# Patient Record
Sex: Female | Born: 1955 | Race: Black or African American | Hispanic: No | Smoking: Never smoker
Health system: Southern US, Community
[De-identification: ages and names within clinical notes are randomized; demographics above are authoritative.]

## PROBLEM LIST (undated history)

## (undated) DIAGNOSIS — I10 Essential (primary) hypertension: Secondary | ICD-10-CM

## (undated) DIAGNOSIS — D649 Anemia, unspecified: Secondary | ICD-10-CM

## (undated) DIAGNOSIS — E58 Dietary calcium deficiency: Secondary | ICD-10-CM

## (undated) DIAGNOSIS — R011 Cardiac murmur, unspecified: Secondary | ICD-10-CM

## (undated) DIAGNOSIS — I739 Peripheral vascular disease, unspecified: Secondary | ICD-10-CM

## (undated) DIAGNOSIS — E785 Hyperlipidemia, unspecified: Secondary | ICD-10-CM

## (undated) HISTORY — DX: Peripheral vascular disease, unspecified: I73.9

## (undated) HISTORY — DX: Hyperlipidemia, unspecified: E78.5

## (undated) HISTORY — DX: Anemia, unspecified: D64.9

---

## 1999-04-16 ENCOUNTER — Emergency Department (HOSPITAL_COMMUNITY): Admission: EM | Admit: 1999-04-16 | Discharge: 1999-04-16 | Payer: Self-pay | Admitting: Emergency Medicine

## 1999-05-26 ENCOUNTER — Emergency Department (HOSPITAL_COMMUNITY): Admission: EM | Admit: 1999-05-26 | Discharge: 1999-05-26 | Payer: Self-pay | Admitting: Internal Medicine

## 1999-10-08 ENCOUNTER — Emergency Department (HOSPITAL_COMMUNITY): Admission: EM | Admit: 1999-10-08 | Discharge: 1999-10-08 | Payer: Self-pay | Admitting: *Deleted

## 2000-07-01 ENCOUNTER — Emergency Department (HOSPITAL_COMMUNITY): Admission: EM | Admit: 2000-07-01 | Discharge: 2000-07-01 | Payer: Self-pay

## 2000-08-19 ENCOUNTER — Inpatient Hospital Stay (HOSPITAL_COMMUNITY): Admission: AD | Admit: 2000-08-19 | Discharge: 2000-08-19 | Payer: Self-pay | Admitting: Obstetrics and Gynecology

## 2000-08-26 ENCOUNTER — Encounter: Payer: Self-pay | Admitting: Obstetrics

## 2000-08-26 ENCOUNTER — Ambulatory Visit (HOSPITAL_COMMUNITY): Admission: RE | Admit: 2000-08-26 | Discharge: 2000-08-26 | Payer: Self-pay | Admitting: Obstetrics

## 2000-09-04 ENCOUNTER — Encounter: Payer: Self-pay | Admitting: Obstetrics

## 2000-09-04 ENCOUNTER — Encounter: Admission: RE | Admit: 2000-09-04 | Discharge: 2000-09-04 | Payer: Self-pay | Admitting: Obstetrics

## 2000-09-19 ENCOUNTER — Inpatient Hospital Stay (HOSPITAL_COMMUNITY): Admission: AD | Admit: 2000-09-19 | Discharge: 2000-09-19 | Payer: Self-pay | Admitting: Obstetrics

## 2000-10-17 ENCOUNTER — Inpatient Hospital Stay (HOSPITAL_COMMUNITY): Admission: AD | Admit: 2000-10-17 | Discharge: 2000-10-17 | Payer: Self-pay | Admitting: Obstetrics

## 2000-11-14 ENCOUNTER — Inpatient Hospital Stay (HOSPITAL_COMMUNITY): Admission: AD | Admit: 2000-11-14 | Discharge: 2000-11-14 | Payer: Self-pay | Admitting: Obstetrics

## 2000-12-12 ENCOUNTER — Inpatient Hospital Stay (HOSPITAL_COMMUNITY): Admission: AD | Admit: 2000-12-12 | Discharge: 2000-12-12 | Payer: Self-pay | Admitting: Obstetrics

## 2001-01-10 ENCOUNTER — Inpatient Hospital Stay (HOSPITAL_COMMUNITY): Admission: AD | Admit: 2001-01-10 | Discharge: 2001-01-10 | Payer: Self-pay | Admitting: Obstetrics

## 2001-09-28 ENCOUNTER — Encounter: Payer: Self-pay | Admitting: Obstetrics

## 2001-09-28 ENCOUNTER — Ambulatory Visit (HOSPITAL_COMMUNITY): Admission: RE | Admit: 2001-09-28 | Discharge: 2001-09-28 | Payer: Self-pay | Admitting: Obstetrics

## 2002-08-12 ENCOUNTER — Ambulatory Visit (HOSPITAL_COMMUNITY): Admission: RE | Admit: 2002-08-12 | Discharge: 2002-08-12 | Payer: Self-pay | Admitting: Family Medicine

## 2002-08-12 ENCOUNTER — Encounter: Payer: Self-pay | Admitting: Family Medicine

## 2002-10-19 ENCOUNTER — Encounter: Admission: RE | Admit: 2002-10-19 | Discharge: 2002-10-19 | Payer: Self-pay | Admitting: Obstetrics

## 2002-10-19 ENCOUNTER — Encounter: Payer: Self-pay | Admitting: Obstetrics

## 2002-11-27 ENCOUNTER — Encounter: Payer: Self-pay | Admitting: Emergency Medicine

## 2002-11-27 ENCOUNTER — Emergency Department (HOSPITAL_COMMUNITY): Admission: AD | Admit: 2002-11-27 | Discharge: 2002-11-27 | Payer: Self-pay | Admitting: Emergency Medicine

## 2003-03-09 ENCOUNTER — Encounter: Admission: RE | Admit: 2003-03-09 | Discharge: 2003-04-27 | Payer: Self-pay | Admitting: Orthopedic Surgery

## 2004-06-27 ENCOUNTER — Ambulatory Visit (HOSPITAL_COMMUNITY): Admission: RE | Admit: 2004-06-27 | Discharge: 2004-06-27 | Payer: Self-pay | Admitting: Cardiology

## 2006-02-20 ENCOUNTER — Emergency Department (HOSPITAL_COMMUNITY): Admission: EM | Admit: 2006-02-20 | Discharge: 2006-02-20 | Payer: Self-pay | Admitting: Emergency Medicine

## 2006-03-25 ENCOUNTER — Encounter: Admission: RE | Admit: 2006-03-25 | Discharge: 2006-03-25 | Payer: Self-pay | Admitting: Internal Medicine

## 2006-03-31 ENCOUNTER — Encounter: Admission: RE | Admit: 2006-03-31 | Discharge: 2006-03-31 | Payer: Self-pay | Admitting: Cardiology

## 2007-02-17 ENCOUNTER — Encounter: Admission: RE | Admit: 2007-02-17 | Discharge: 2007-02-17 | Payer: Self-pay | Admitting: Internal Medicine

## 2008-08-25 ENCOUNTER — Inpatient Hospital Stay (HOSPITAL_COMMUNITY): Admission: AD | Admit: 2008-08-25 | Discharge: 2008-08-26 | Payer: Self-pay | Admitting: Obstetrics and Gynecology

## 2008-08-28 ENCOUNTER — Inpatient Hospital Stay (HOSPITAL_COMMUNITY): Admission: AD | Admit: 2008-08-28 | Discharge: 2008-08-28 | Payer: Self-pay | Admitting: Obstetrics & Gynecology

## 2008-09-14 ENCOUNTER — Ambulatory Visit: Payer: Self-pay | Admitting: Obstetrics and Gynecology

## 2008-09-14 ENCOUNTER — Encounter: Payer: Self-pay | Admitting: Obstetrics and Gynecology

## 2008-09-14 ENCOUNTER — Other Ambulatory Visit: Admission: RE | Admit: 2008-09-14 | Discharge: 2008-09-14 | Payer: Self-pay | Admitting: Obstetrics and Gynecology

## 2008-10-12 ENCOUNTER — Ambulatory Visit: Payer: Self-pay | Admitting: Obstetrics & Gynecology

## 2009-10-23 ENCOUNTER — Encounter: Admission: RE | Admit: 2009-10-23 | Discharge: 2009-10-23 | Payer: Self-pay | Admitting: Nurse Practitioner

## 2009-12-12 ENCOUNTER — Encounter: Admission: RE | Admit: 2009-12-12 | Discharge: 2009-12-12 | Payer: Self-pay | Admitting: Family Medicine

## 2010-04-07 ENCOUNTER — Encounter: Payer: Self-pay | Admitting: Internal Medicine

## 2010-04-18 ENCOUNTER — Emergency Department (HOSPITAL_COMMUNITY)
Admission: EM | Admit: 2010-04-18 | Discharge: 2010-04-18 | Disposition: A | Discharge status: Home / Self Care | Payer: Self-pay | Attending: Emergency Medicine | Admitting: Emergency Medicine

## 2010-04-18 ENCOUNTER — Emergency Department (HOSPITAL_COMMUNITY): Payer: Self-pay

## 2010-04-18 DIAGNOSIS — R059 Cough, unspecified: Secondary | ICD-10-CM | POA: Insufficient documentation

## 2010-04-18 DIAGNOSIS — J069 Acute upper respiratory infection, unspecified: Secondary | ICD-10-CM | POA: Insufficient documentation

## 2010-04-18 DIAGNOSIS — R05 Cough: Secondary | ICD-10-CM | POA: Insufficient documentation

## 2010-04-18 DIAGNOSIS — I10 Essential (primary) hypertension: Secondary | ICD-10-CM | POA: Insufficient documentation

## 2010-04-18 DIAGNOSIS — IMO0001 Reserved for inherently not codable concepts without codable children: Secondary | ICD-10-CM | POA: Insufficient documentation

## 2010-04-18 IMAGING — CR DG CHEST 2V
2 series · 2 of 2 positions shown · non-contrast
Comparison: [DATE]

CLINICAL DATA: Cough

CHEST - 2 VIEW

[w chest pa]
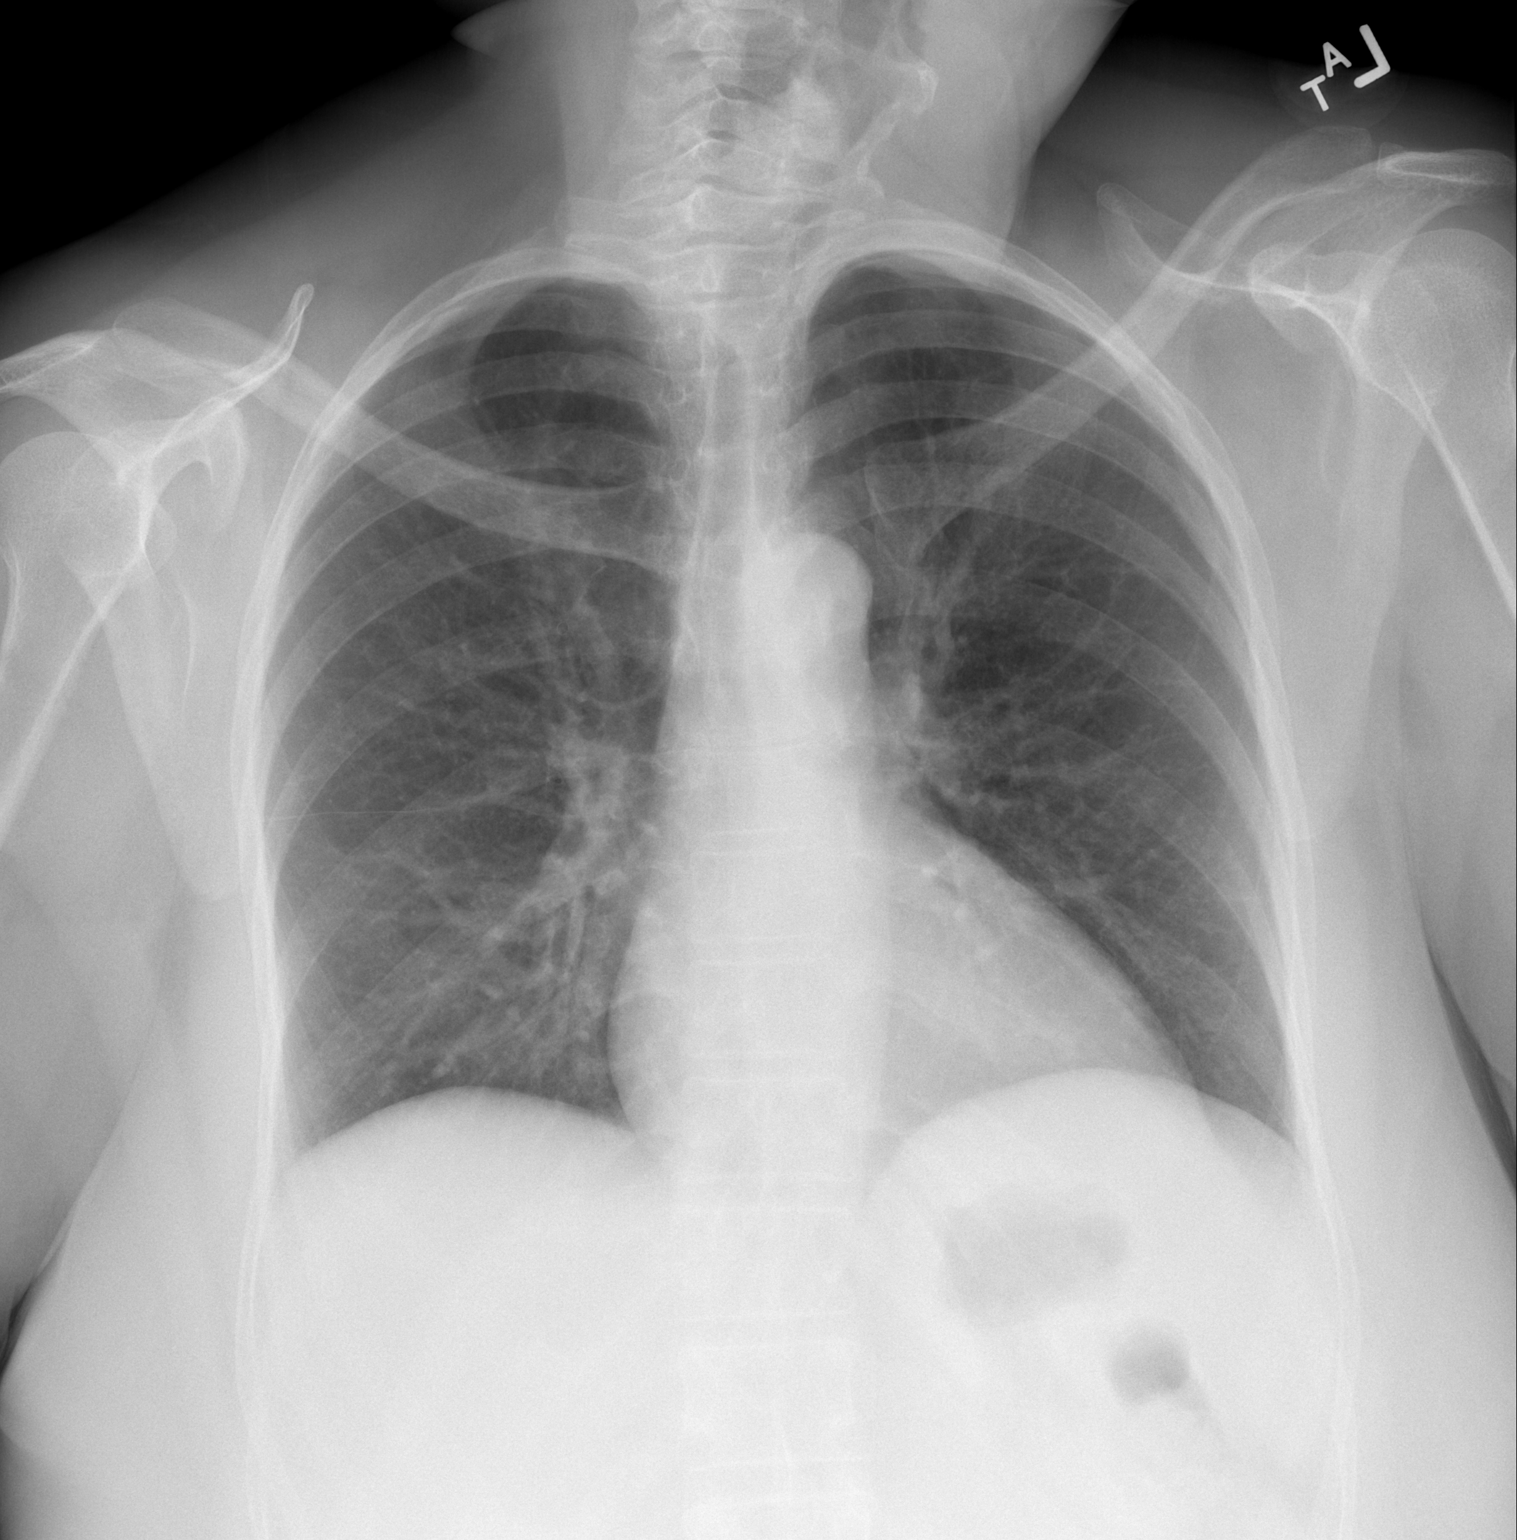

[w chest lat]
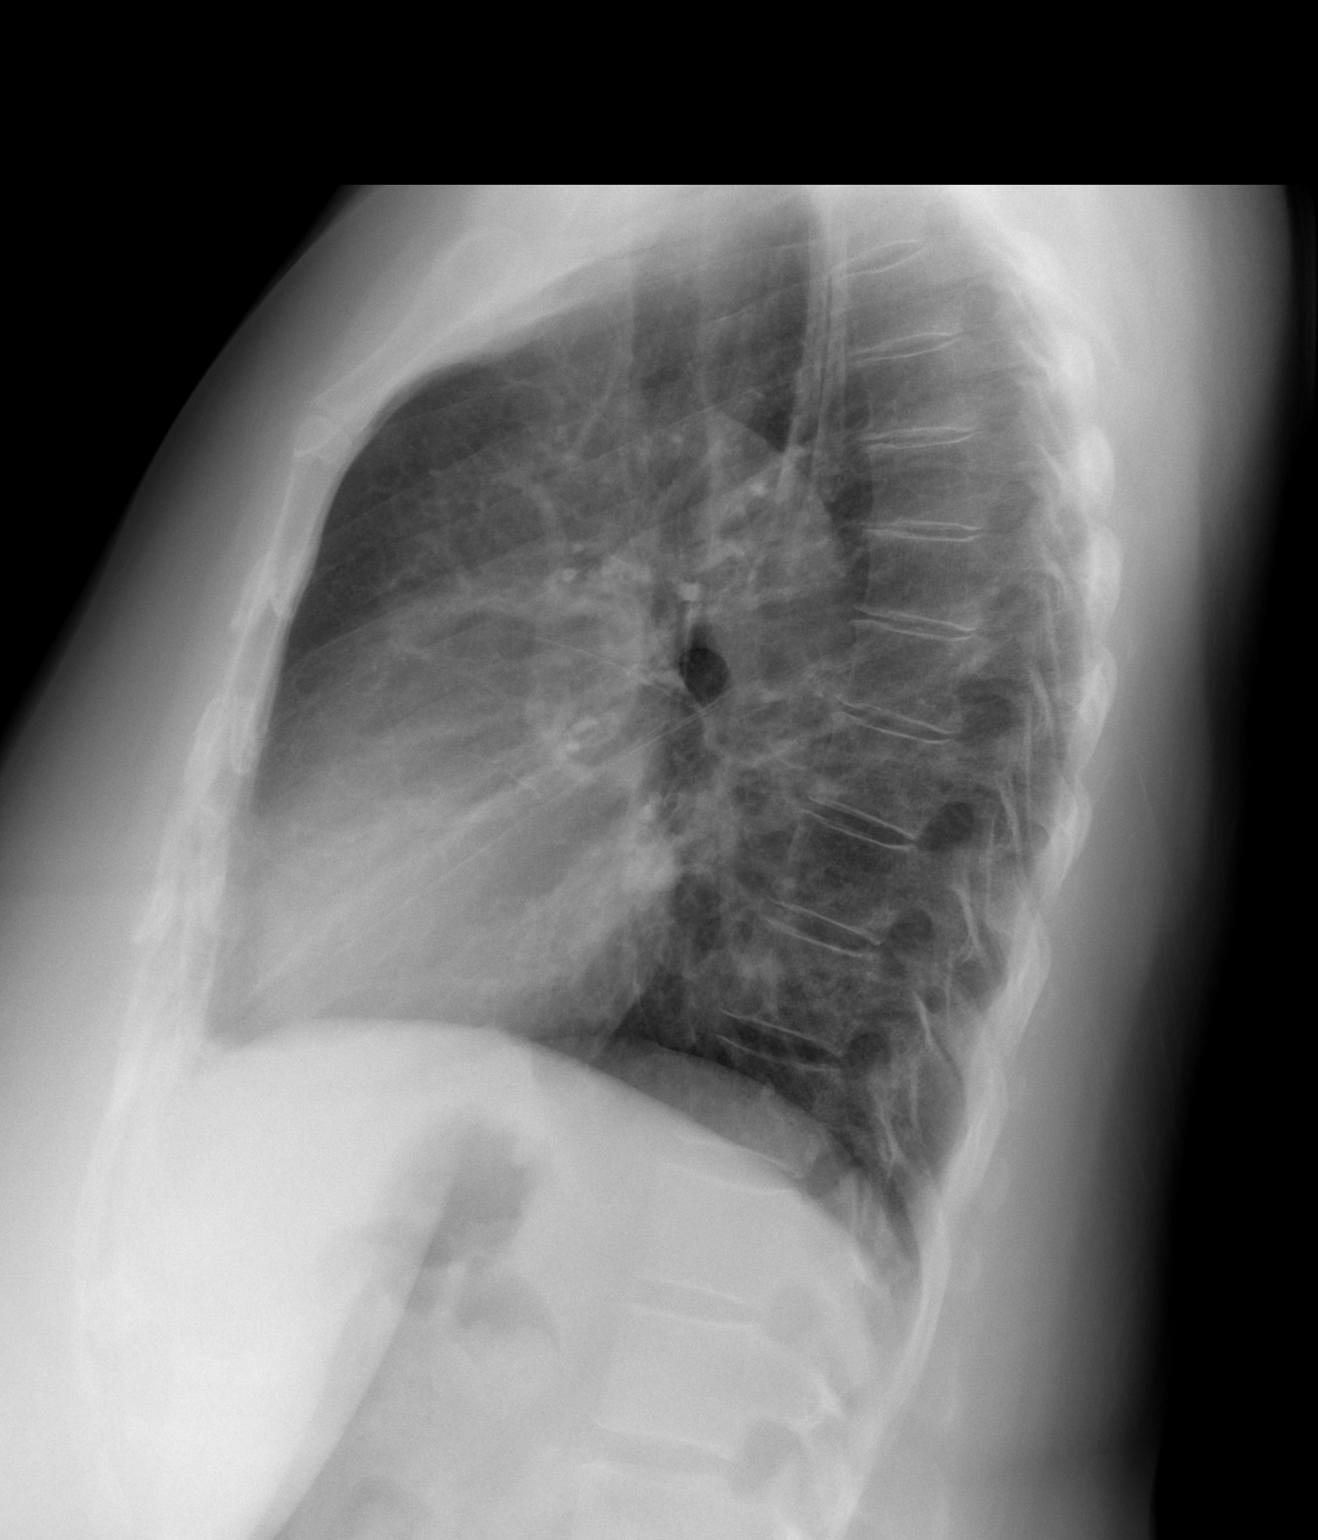

[2 of 2 positions shown; findings below may reference images not displayed]

FINDINGS: Heart and mediastinal contours normal.  Lungs clear.  No
pleural fluid.  Osseous structures and soft tissues unremarkable.
IMPRESSION: No acute or significant findings.

## 2010-07-27 ENCOUNTER — Emergency Department (HOSPITAL_COMMUNITY)
Admission: EM | Admit: 2010-07-27 | Discharge: 2010-07-27 | Disposition: A | Discharge status: Home / Self Care | Payer: Self-pay | Attending: Emergency Medicine | Admitting: Emergency Medicine

## 2010-07-27 ENCOUNTER — Emergency Department (HOSPITAL_COMMUNITY): Payer: Self-pay

## 2010-07-27 DIAGNOSIS — J40 Bronchitis, not specified as acute or chronic: Secondary | ICD-10-CM | POA: Insufficient documentation

## 2010-07-27 DIAGNOSIS — J3489 Other specified disorders of nose and nasal sinuses: Secondary | ICD-10-CM | POA: Insufficient documentation

## 2010-07-27 DIAGNOSIS — I10 Essential (primary) hypertension: Secondary | ICD-10-CM | POA: Insufficient documentation

## 2010-07-27 DIAGNOSIS — R05 Cough: Secondary | ICD-10-CM | POA: Insufficient documentation

## 2010-07-27 DIAGNOSIS — R059 Cough, unspecified: Secondary | ICD-10-CM | POA: Insufficient documentation

## 2010-07-30 NOTE — Group Therapy Note (Signed)
NAME:  ANAB, VIVAR NO.:  [id number]   MEDICAL RECORD NO.:  [id number]          PATIENT TYPE:  WOC   LOCATION:  [facility]                   FACILITY:  [facility]   PHYSICIAN:  Argentina Donovan, MD        DATE OF BIRTH:  11-27-1955   DATE OF SERVICE:  09/14/2008                                  CLINIC NOTE   The patient is a 55 year old very pleasant African American female  gravida 6, para 4-0-2-4 who was referred by the maternity admissions  after having gone in there because of an episode of postmenopausal  bleeding.  The patient had no bleeding for 2 years and then in early  June, she stopped drinking sodas and took some kind of a drink with  caffeine and her breast became very tender and she thought that must be  from the caffeine, but she got a headache.  She said, I felt like a  teenager about to have her period.  Then lo and behold, on came her  period, like a regular period.  It stopped, but she got worried and went  into the maternity admissions and was referred down here.  The patient  has a strong family history of breast cancer with her mother and sister.  So, she make sure she gets her mammograms annually and they have all  been normal.  Today, when we saw her, her blood pressure is 166/89 with  a pulse of 89, but looking back in the [facility] she had a completely normal  blood pressure of 133/72 on several occasions.  She is on medication for  blood pressure, Avalide 300 daily.  She takes an 81 mg of ASA a day  because of a heart murmur and she takes vitamin 12 every day, takes no  other medications.  She is generally in good health and is followed  regularly by she says her heart doctor and has had recent physical  examination.  From my point-of-view, we did a breast examination.  She  has extremely large pendulous breasts with no dominant masses, no nipple  discharge, no intertrigo, no supraclavicular nor axillary nodes noted.  The abdomen is soft, flat,  nontender.  No masses, no organomegaly.  External genitalia is normal.  BUS within normal limits.  Vagina is  clean and well rugated.  Cervix is clean and parous.  The uterus is  anterior of normal size and shape, appears to be consistency and the  adnexa could not be well outlined.  Pap smear was taken, as well as  endometrial biopsy after the uterus was sounded to a depth of 8 cm.  A  small amount of endometrial tissue was obtained.  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   IMPRESSION:  Postmenopausal bleeding.           ______________________________  Argentina Donovan, MD     PR/MEDQ  D:  09/14/2008  T:  09/15/2008  Job:  [id number]

## 2011-07-10 ENCOUNTER — Encounter (HOSPITAL_COMMUNITY): Payer: Self-pay | Admitting: *Deleted

## 2011-07-10 ENCOUNTER — Emergency Department (HOSPITAL_COMMUNITY)
Admission: EM | Admit: 2011-07-10 | Discharge: 2011-07-10 | Disposition: A | Discharge status: Home / Self Care | Payer: Self-pay | Attending: Emergency Medicine | Admitting: Emergency Medicine

## 2011-07-10 ENCOUNTER — Emergency Department (HOSPITAL_COMMUNITY): Payer: Self-pay

## 2011-07-10 DIAGNOSIS — IMO0001 Reserved for inherently not codable concepts without codable children: Secondary | ICD-10-CM | POA: Insufficient documentation

## 2011-07-10 DIAGNOSIS — R220 Localized swelling, mass and lump, head: Secondary | ICD-10-CM | POA: Insufficient documentation

## 2011-07-10 DIAGNOSIS — R221 Localized swelling, mass and lump, neck: Secondary | ICD-10-CM | POA: Insufficient documentation

## 2011-07-10 DIAGNOSIS — I160 Hypertensive urgency: Secondary | ICD-10-CM

## 2011-07-10 DIAGNOSIS — H538 Other visual disturbances: Secondary | ICD-10-CM | POA: Insufficient documentation

## 2011-07-10 DIAGNOSIS — H53149 Visual discomfort, unspecified: Secondary | ICD-10-CM | POA: Insufficient documentation

## 2011-07-10 DIAGNOSIS — I10 Essential (primary) hypertension: Secondary | ICD-10-CM | POA: Insufficient documentation

## 2011-07-10 DIAGNOSIS — H5462 Unqualified visual loss, left eye, normal vision right eye: Secondary | ICD-10-CM

## 2011-07-10 DIAGNOSIS — R011 Cardiac murmur, unspecified: Secondary | ICD-10-CM | POA: Insufficient documentation

## 2011-07-10 HISTORY — DX: Dietary calcium deficiency: E58

## 2011-07-10 HISTORY — DX: Essential (primary) hypertension: I10

## 2011-07-10 HISTORY — DX: Cardiac murmur, unspecified: R01.1

## 2011-07-10 LAB — URINALYSIS, ROUTINE W REFLEX MICROSCOPIC
Glucose, UA: NEGATIVE mg/dL
Hgb urine dipstick: NEGATIVE
Leukocytes, UA: NEGATIVE
Nitrite: NEGATIVE
Specific Gravity, Urine: 1.007 (ref 1.005–1.030)
Urobilinogen, UA: 0.2 mg/dL (ref 0.0–1.0)

## 2011-07-10 LAB — BASIC METABOLIC PANEL
BUN: 11 mg/dL (ref 6–23)
Chloride: 103 mEq/L (ref 96–112)
Creatinine, Ser: 0.85 mg/dL (ref 0.50–1.10)
Glucose, Bld: 95 mg/dL (ref 70–99)
Sodium: 142 mEq/L (ref 135–145)

## 2011-07-10 MED ORDER — LOSARTAN POTASSIUM 50 MG PO TABS
100.0000 mg | ORAL_TABLET | ORAL | Status: AC
  Administered 2011-07-10: 100 mg via ORAL
  Filled 2011-07-10: qty 2

## 2011-07-10 MED ORDER — IBUPROFEN 200 MG PO TABS
600.0000 mg | ORAL_TABLET | Freq: Once | ORAL | Status: AC
  Administered 2011-07-10: 600 mg via ORAL
  Filled 2011-07-10: qty 3

## 2011-07-10 MED ORDER — LOSARTAN POTASSIUM 100 MG PO TABS: 100.0000 mg | ORAL_TABLET | Freq: Every day | ORAL | Status: DC

## 2011-07-10 MED ORDER — METOPROLOL TARTRATE 50 MG PO TABS: 50.0000 mg | ORAL_TABLET | Freq: Once | ORAL | Status: DC

## 2011-07-10 MED ORDER — METOPROLOL TARTRATE 25 MG PO TABS
50.0000 mg | ORAL_TABLET | Freq: Once | ORAL | Status: AC
  Administered 2011-07-10: 50 mg via ORAL
  Filled 2011-07-10: qty 2

## 2011-07-10 NOTE — Discharge Instructions (Signed)
RESOURCE GUIDE  Dental Problems  Patients with Medicaid: [facility]                     [facility] [address]                                           [address] Phone:  [phone]                                                  Phone:  [phone]  If unable to pay or uninsured, contact:  [facility] or [facility] to become qualified for the adult dental clinic.  Chronic Pain Problems Contact [facility]  [phone] Patients need to be referred by their primary care doctor.  Insufficient Money for Medicine Contact [employer]:  call "211" or [facility] 271-5999.  No Primary Care Doctor Call [facility]  832-8000 Other agencies that provide inexpensive medical care    [facility]  [phone]    [facility]  [phone]    [facility]  [phone]    [facility] Clinic  [phone]    [facility]  [phone]    [facility]  [phone]  Psychological Services [facility]  [phone] [facility]  [phone] [facility]   800 853-5163 (emergency services 641-4993)  Substance Abuse Resources [facility]  336-882-2125 [facility] 336-784-9470 [facility] 336-285-9073 [facility] 336-845-3988 Residential & Outpatient Substance Abuse Program  800-659-3381  Abuse/Neglect [place] Child Abuse Hotline (336) 641-3795 [place] [facility] Abuse Hotline 800-378-5315 (After Hours)  Emergency Shelter [facility] (336) 271-5985  M[facility] Homes [facility] (336) 275-9566 [facility] (704) 372-4663  MRSA Hotline #:   832-7006    [place] [facility]  [facility] [place] [facility]     [employer]                          [facility] [address] [place]                       [address]      [address]  [place]                                                [place]                            [place] Phone:  [phone]                                   Phone:  [phone]                 Phone:  [phone]  [facility] Phone:  [phone]    [facility] Child Abuse Hotline 559-455-5854 4315639340 (After Hours)  Hypertension Hypertension is another name for high blood pressure. High blood pressure may mean that your heart needs to work harder to pump blood. Blood pressure consists of two numbers, which includes a higher number over a lower number (example: 110/72). HOME CARE   Make lifestyle changes as told by your doctor. This may include weight loss and exercise.   Take your blood pressure medicine every day.   Limit how much salt you use.   Stop smoking if you smoke.   Do not use drugs.   Talk to your doctor if you are using decongestants or birth control pills. These medicines might make blood pressure higher.   Females should not drink more than 1 alcoholic drink per day. Males should not drink more than 2 alcoholic drinks per day.   See your doctor as told.  GET HELP RIGHT AWAY IF:   You have a blood pressure reading with a top number of 180 or higher.   You get a very bad headache.   You get blurred or changing vision.   You feel confused.   You feel weak, numb, or faint.   You get chest or belly (abdominal) pain.   You throw up (vomit).   You cannot breathe very well.  MAKE SURE YOU:   Understand these instructions.   Will watch your condition.   Will get help right away if you are not doing well or get worse.  Document Released: 08/20/2007 Document Revised: 02/20/2011 Document Reviewed: 08/20/2007 [facility] Patient Information 2012 [place], [place].Coronary Artery Disease, Risk Factors Research has shown that the risk of developing coronary artery disease (CAD) and having a heart attack increases with  each factor you have. RISK FACTORS YOU CANNOT CHANGE  Your age. Your risk goes up as you get older. Most heart attacks happen to people over the age of 64.   Gender. Men have a greater risk of heart attack than women, and they have attacks earlier in life. However, women are more likely to die from a heart attack.   Heredity. Children of parents with heart disease are more likely to develop it themselves.   Race. African Americans and other ethnic groups have a higher risk, possibly because of high blood pressure, a tendency toward obesity, and diabetes.   Your family. Most people with a strong family history of heart disease have one or more other risk factors.  RISK FACTORS YOU CAN CHANGE  Exposure to tobacco smoke. Even secondhand smoke greatly increases the risk for heart disease.   High blood cholesterol may be lowered with changes in diet, activity, and medicines.   High blood pressure makes the heart work harder. This causes the heart muscles to become thick and, eventually, weaker. It also increases your risk of stroke, heart attack, and kidney or heart failure.   Physical inactivity is a risk factor for CAD. Regular physical activity helps prevent heart and blood vessel disease. Exercise helps control blood cholesterol, diabetes, obesity, and it may help lower blood pressure in some people.   Excess body fat, especially belly fat, increases the risk of heart disease and stroke even if there are no other risk factors. Excess weight increases the heart's workload and raises blood pressure and blood cholesterol.   Diabetes seriously increases your risk of developing CAD. If you have diabetes, you should work with your caregiver to manage it and control other risk factors.  OTHER RISK FACTORS  FOR CAD  How you respond to stress.   Drinking too much alcohol may raise blood pressure, cause heart failure, and lead to stroke.   Total cholesterol greater than 200 milligrams.   HDL  (good) cholesterol less than 40 milligrams. HDL helps keep cholesterol from building up in the walls of the arteries.  PREVENTING CAD  Maintain a healthy weight.   Exercise or do physical activity.   Eat a heart-healthy diet low in fat and salt and high in fiber.   Control your blood pressure to keep it below 120 over 80.   Keep your cholesterol at a level that lowers your risk.   Manage diabetes if you have it.   Stop smoking.   Learn how to manage stress.  HEART SMART SUBSTITUTIONS  Instead of whole or 2% milk and cream, use skim milk.   Instead of fried foods, eat baked, steamed, boiled, broiled, or microwaved foods.   Instead of lard, butter, palm and coconut oils, cook with unsaturated vegetable oils, such as corn, olive, canola, safflower, sesame, soybean, sunflower, or peanut.   Instead of fatty cuts of meat, eat lean cuts of meat or cut off the fatty parts.   Instead of 1 whole egg in recipes, use 2 egg whites.   Instead of sauces, butter, and salt, season vegetables with herbs and spices.   Instead of regular hard and processed cheeses, eat low-fat, low-sodium cheeses.   Instead of salted potato chips, choose low-fat, unsalted tortilla and potato chips and unsalted pretzels and popcorn.   Instead of sour cream and mayonnaise, use plain low-fat yogurt, low-fat cottage cheese, or low-fat or "light" sour cream.  FOR MORE INFORMATION  National Heart Lung and Blood Institute: https://nielsen.com/ American Heart Association: PopSteam.is Document Released: 05/24/2003 Document Revised: 02/20/2011 Document Reviewed: 05/19/2007 [facility] Patient Information 2012 [place], [place].How to Take Your Blood Pressure  These instructions are only for electronic home blood pressure machines. You will need:   An automatic or semi-automatic blood pressure machine.   Fresh batteries for the blood pressure machine.  HOW DO I USE THESE TOOLS TO CHECK MY BLOOD  PRESSURE?   There are 2 numbers that make up your blood pressure. For example: 120/80.   The first number (120 in our example) is called the "systolic pressure." It is a measure of the pressure in your blood vessels when your heart is pumping blood.   The second number (80 in our example) is called the "diastolic pressure." It is a measure of the pressure in your blood vessels when your heart is resting between beats.   Before you buy a home blood pressure machine, check the size of your arm so you can buy the right size cuff. Here is how to check the size of your arm:   Use a tape measure that shows both inches and centimeters.   Wrap the tape measure around the middle upper part of your arm. You may need someone to help you measure right.   Write down your arm measurement in both inches and centimeters.   To measure your blood pressure right, it is important to have the right size cuff.   If your arm is up to 13 inches (37 to 34 centimeters), get an adult cuff size.   If your arm is 13 to 17 inches (35 to 44 centimeters), get a large adult cuff size.   If your arm is 17 to 20 inches (45 to 52 centimeters), get an adult thigh cuff.  Try to rest or relax for at least 30 minutes before you check your blood pressure.   Do not smoke.   Do not have any drinks with caffeine, such as:   Pop.   Coffee.   Tea.   Check your blood pressure in a quiet room.   Sit down and stretch out your arm on a table. Keep your arm at about the level of your heart. Let your arm relax.  GETTING BLOOD PRESSURE READINGS  Make sure you remove any tight-fighting clothing from your arm. Wrap the cuff around your upper arm. Wrap it just above the bend, and above where you felt the pulse. You should be able to slip a finger between the cuff and your arm. If you cannot slip a finger in the cuff, it is too tight and should be removed and rewrapped.   Some units requires you to manually pump up the arm cuff.    Automatic units inflate the cuff when you press a button.   Cuff deflation is automatic in both models.   After the cuff is inflated, the unit measures your blood pressure and pulse. The readings are displayed on a monitor. Hold still and breathe normally while the cuff is inflated.   Getting a reading takes less than a minute.   Some models store readings in a memory. Some provide a printout of readings.   Get readings at different times of the day. You should wait at least 5 minutes between readings. Take readings with you to your next doctor's visit.  Document Released: 02/14/2008 Document Revised: 02/20/2011 Document Reviewed: 02/14/2008 [facility] Patient Information 2012 [place], [place].

## 2011-07-10 NOTE — ED Notes (Signed)
Pt received to RM 9 with c/o blurry vision to the lt eye getting progressively worse and elevated BP. Pt claimed that she has not taken her BP medications in one year. Pt denies any dizziness, no chest pain , no headache nor weakness. Pt is NAD, attached to cardiac monitor.

## 2011-07-10 NOTE — ED Notes (Signed)
Pt claimed that she took Lisinopril/HCTZ 20/25 mg po at 0730

## 2011-07-10 NOTE — ED Notes (Signed)
Pt reports HTN.  States that her face was swelling this am, so she took her BP at home and it was 207/115 at home.  States that her vision has been blurred-pt denies difficulty driving this am.  Pt took a family members BP medication, 81mg  asa and 2 extra tylenol, and a tablespoon of vinegar after taking her BP.  Reports that she is supposed to be on BP medication of her own but has not been able to afford it.

## 2011-07-10 NOTE — ED Provider Notes (Signed)
History     CSN: [id number]  Arrival date & time 07/10/11  0102   First MD Initiated Contact with Patient 07/10/11 [phone]      Chief Complaint  Patient presents with  . Hypertension     Patient is a 56 y.o. female presenting with hypertension. The history is provided by the patient. No language interpreter was used.  Hypertension This is a chronic problem. The current episode started more than 1 year ago. The problem occurs intermittently. The problem has been gradually worsening. Associated symptoms include a visual change. Pertinent negatives include no abdominal pain, arthralgias, change in bowel habit, chest pain, chills, congestion, coughing, diaphoresis, fatigue, fever, headaches, joint swelling, nausea, neck pain, numbness, rash, sore throat, vertigo, vomiting or weakness. The symptoms are aggravated by stress. She has tried nothing for the symptoms. Improvement on treatment: No treatments tried.     Past Medical History  Diagnosis Date  . Hypertension   . Calcium deficiency   . Fibromyalgia   . Heart murmur     History reviewed. No pertinent past surgical history.  History reviewed. No pertinent family history.  History  Substance Use Topics  . Smoking status: Never Smoker   . Smokeless tobacco: Not on file  . Alcohol Use: No    OB History    Grav Para Term Preterm Abortions TAB SAB Ect Mult Living                  Review of Systems  Constitutional: Negative for fever, chills, diaphoresis and fatigue.  HENT: Positive for facial swelling. Negative for congestion, sore throat, trouble swallowing, neck pain and neck stiffness.        Face swelling resolved.   Eyes: Positive for visual disturbance. Negative for photophobia, pain, discharge and itching.  Respiratory: Negative for cough, chest tightness and shortness of breath.   Cardiovascular: Negative for chest pain, palpitations and leg swelling.  Gastrointestinal: Negative for nausea, vomiting, abdominal pain,  diarrhea, constipation, blood in stool and change in bowel habit.  Genitourinary: Negative for dysuria, urgency, frequency, hematuria, flank pain, decreased urine volume, difficulty urinating and pelvic pain.  Musculoskeletal: Negative for back pain, joint swelling and arthralgias.  Skin: Negative for rash and wound.  Neurological: Negative for dizziness, vertigo, tremors, seizures, syncope, facial asymmetry, speech difficulty, weakness, light-headedness, numbness and headaches.  Hematological: Negative for adenopathy. Does not bruise/bleed easily.  Psychiatric/Behavioral: Negative for confusion and decreased concentration.    Allergies  Review of patient's allergies indicates no known allergies.  Home Medications   Current Outpatient Rx  Name Route Sig Dispense Refill  . ASPIRIN EC 81 MG PO TBEC Oral Take 81 mg by mouth daily.      BP 191/93  Pulse 81  Temp(Src) 97.8 F (36.6 C) (Oral)  Resp 18  SpO2 99%  Physical Exam  Constitutional: She is oriented to person, place, and time. She appears well-developed and well-nourished. No distress.  HENT:  Head: Normocephalic and atraumatic.  Mouth/Throat: Oropharynx is clear and moist. No oropharyngeal exudate.  Eyes: Conjunctivae and EOM are normal. Pupils are equal, round, and reactive to light. Right eye exhibits no discharge and no exudate. No foreign body present in the right eye. Left eye exhibits no discharge and no exudate. No foreign body present in the left eye. No scleral icterus.         R eye 20/20. L eye 20/50. L fundus possible flame hemorrhage. Pt poorly cooperative on exam 2/2 photophobia in L eye.  Neck: Normal range of motion. Neck supple. No JVD present.  Cardiovascular: Normal rate, regular rhythm and intact distal pulses.   Murmur heard.  Systolic murmur is present with a grade of 2/6       Known history of murmur per pt.   Pulmonary/Chest: Effort normal and breath sounds normal. No respiratory distress. She has  no decreased breath sounds. She has no wheezes. She has no rhonchi. She has no rales. She exhibits no tenderness.  Abdominal: Soft. Bowel sounds are normal. She exhibits no distension and no mass. There is no tenderness. There is no rebound and no guarding.  Musculoskeletal: Normal range of motion. She exhibits no tenderness.  Neurological: She is alert and oriented to person, place, and time.  Skin: Skin is warm and dry. She is not diaphoretic.  Psychiatric: She has a normal mood and affect.    ED Course  Procedures (including critical care time)  Labs Reviewed  BASIC METABOLIC PANEL - Abnormal; Notable for the following:    GFR calc non Af Amer 75 (*)    GFR calc Af Amer 87 (*)    All other components within normal limits  URINALYSIS, ROUTINE W REFLEX MICROSCOPIC   Dg Chest 2 View  07/10/2011  *RADIOLOGY REPORT*  Clinical Data: Hypertension and dizziness  CHEST - 2 VIEW  Comparison: 07/27/2010  Findings: The heart size and mediastinal contours are within normal limits.  Both lungs are clear.  The visualized skeletal structures are unremarkable.  IMPRESSION: No active cardiopulmonary abnormalities  Original Report Authenticated By: Rosealee Albee, M.D.   Ct Head Wo Contrast  07/10/2011  *RADIOLOGY REPORT*  Clinical Data: Hypertension.  Facial swelling.  Blurred vision.  CT HEAD WITHOUT CONTRAST  Technique:  Contiguous axial images were obtained from the base of the skull through the vertex without contrast.  Comparison: None.  Findings: There is no evidence for acute infarction, intracranial hemorrhage, mass lesion, hydrocephalus, or extra-axial fluid. There is no atrophy or white matter disease.  The calvarium is intact.  The mastoid air cells are clear.  There is no sinus fluid.  In the pineal region, there is an eccentric 9 mm pineal cyst projecting just slightly rightward of the midline calcification. No visible hyperdense soft tissue  mural nodule or other aggressive feature.  In the  medial right middle cranial fossa, there is a subcentimeter extra-axial calcified or ossified lesion projecting from the greater wing of the sphenoid ( series 3, image 8-9) towards the temporal lobe.  This is likely a tiny meningioma.  Given its diameter of 3 mm, it is highly unlikely to be symptomatic.  There is no encroachment on the orbital fissure or cavernous sinus.  IMPRESSION: No acute intracranial abnormality.  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  Nonaggressive appearing 9 mm pineal cyst.  Original Report Authenticated By: Elsie Stain, M.D.     1. Hypertensive urgency   2. Vision loss of left eye       Date: 07/10/2011  Rate: 72  Rhythm: normal sinus rhythm  QRS Axis: normal  Intervals: normal  ST/T Wave abnormalities: normal  Conduction Disutrbances:none  Narrative Interpretation:   Old EKG Reviewed: none available   MDM  Pt is a well appearing 56yo AAF with PMH of HTN noncomplaint with meds 2/2 cost who presents with 2 weeks of face swelling and blurry vision which she states are her typical symptoms when she knows her BP is elevated. Pt states she has also been under  a tremendous amount of stress at work which she feels has been making her BP worse. BP noted 207/115 at home, 191 systolic in triage and 204/04 while in exam room. No focal neuro deficits but possible L funduscopic flame hemorrhage and visual acuity 20/50 on L and 20/20 on R. Head CT, EKG, CXR and labs pending.   CT head negative and labs, CXR unremarkable as well. Home meds given with improvement in sx. Pt denies any sx while in ED other than mild vision loss on L side which has been there for a few weeks. BP improved to 168/82 after home meds. Given no other evidence of end organ damage from HTN and BP improving on oral meds pt felt to be stable for d/c with PCP and ophthalmology f/u. Scripts for home meds given which pt states she will be able to afford.        Consuello Masse, MD 07/10/11  1536

## 2011-07-12 NOTE — ED Provider Notes (Signed)
Evaluation and management procedures were performed by the PA/NP/resident physician under my supervision/collaboration.   Baila Rouse D Tyrelle Raczka, MD 07/12/11 2053 

## 2011-10-24 ENCOUNTER — Emergency Department (HOSPITAL_COMMUNITY)
Admission: EM | Admit: 2011-10-24 | Discharge: 2011-10-24 | Disposition: A | Discharge status: Home / Self Care | Payer: Self-pay | Attending: Emergency Medicine | Admitting: Emergency Medicine

## 2011-10-24 DIAGNOSIS — Z79899 Other long term (current) drug therapy: Secondary | ICD-10-CM | POA: Insufficient documentation

## 2011-10-24 DIAGNOSIS — I10 Essential (primary) hypertension: Secondary | ICD-10-CM | POA: Insufficient documentation

## 2011-10-24 DIAGNOSIS — Z760 Encounter for issue of repeat prescription: Secondary | ICD-10-CM | POA: Insufficient documentation

## 2011-10-24 MED ORDER — LOSARTAN POTASSIUM 100 MG PO TABS: 100.0000 mg | ORAL_TABLET | Freq: Every day | ORAL | Status: DC

## 2011-10-24 MED ORDER — METOPROLOL TARTRATE 50 MG PO TABS: 50.0000 mg | ORAL_TABLET | Freq: Once | ORAL | Status: DC

## 2011-10-24 NOTE — ED Notes (Addendum)
BP elevated - doesn't have a pcp, no insurance, and it would be September to seek physician. Was here in July for elevated BP and given prescription for losartan and lopressor. sbp 175? When on these medications.

## 2011-10-24 NOTE — ED Provider Notes (Signed)
History   This chart was scribed for Paula Kras, MD by Toya Smothers. The patient was seen in room TR05C/TR05C. Patient's care was started at 1700.  CSN: [id number]  Arrival date & time 10/24/11  1700   First MD Initiated Contact with Patient 10/24/11 1722      No chief complaint on file.  The history is provided by the patient. No language interpreter was used.    Paula Alexander is a 56 y.o. female presents to the ED asymptomatically to refill her blood pressure medications of Cozaar and Lopressor. She reports that she does not have a PCP and it will be another month before she is able to be seen by a physician. Pt denies fever, chills, nausea, emesis, and diarrhea.   Past Medical History  Diagnosis Date  . Hypertension   . Calcium deficiency   . Fibromyalgia   . Heart murmur     No past surgical history on file.  No family history on file.  History  Substance Use Topics  . Smoking status: Never Smoker   . Smokeless tobacco: Not on file  . Alcohol Use: No   Review of Systems  Respiratory: Negative for shortness of breath.   Cardiovascular: Negative for chest pain.  Neurological: Negative for speech difficulty, numbness and headaches.  All other systems reviewed and are negative.    Allergies  Review of patient's allergies indicates no known allergies.  Home Medications   Current Outpatient Rx  Name Route Sig Dispense Refill  . ASPIRIN EC 81 MG PO TBEC Oral Take 81 mg by mouth daily.    Marland Kitchen LOSARTAN POTASSIUM 100 MG PO TABS Oral Take 1 tablet (100 mg total) by mouth daily. 30 tablet 2    Home med.  Marland Kitchen METOPROLOL TARTRATE 50 MG PO TABS Oral Take 1 tablet (50 mg total) by mouth once. 30 tablet 2    Previous home med.  . TOBRAMYCIN SULFATE 0.3 % OP SOLN Left Eye Place 1 drop into the left eye daily.      BP 185/77  Pulse 103  Temp 98.1 F (36.7 C) (Oral)  Resp 18  SpO2 97%  Physical Exam  Nursing note and vitals reviewed. Constitutional: She appears  well-developed and well-nourished. No distress.  HENT:  Head: Normocephalic and atraumatic.  Right Ear: External ear normal.  Left Ear: External ear normal.  Eyes: Conjunctivae are normal. Right eye exhibits no discharge. Left eye exhibits no discharge. No scleral icterus.  Neck: Neck supple. No tracheal deviation present.  Cardiovascular: Normal rate, regular rhythm and normal heart sounds.   Pulmonary/Chest: Effort normal and breath sounds normal. No stridor. No respiratory distress.  Musculoskeletal: She exhibits no edema.  Neurological: She is alert. Cranial nerve deficit: no gross deficits.  Skin: Skin is warm and dry. No rash noted.  Psychiatric: She has a normal mood and affect.    ED Course  Procedures (including critical care time) DIAGNOSTIC STUDIES: Oxygen Saturation is 97% on room air, normal by my interpretation.    COORDINATION OF CARE: 1726- Evaluated Pt. Pt is awake, alert, and without distress.   Labs Reviewed - No data to display No results found.  1. Hypertension       MDM  The patient presents with asymptomatic hypertension. She has previously been seen for trouble with high blood pressure. Patient does not have a primary care Dr.  she does have plans to see one but it is not until September .  patient states  she will run out of her blood pressure medications and needs a refill. Patient denies chest pain, shortness of breath, abdominal pain or other complaints.  I will refill her blood pressure medications for her today. She does have plans to continue treatment and evaluation with primary-care Dr.    I personally performed the services described in this documentation, which was scribed in my presence.  The recorded information has been reviewed and considered.    Paula Kras, MD 10/24/11 [phone]

## 2012-02-03 ENCOUNTER — Emergency Department (HOSPITAL_COMMUNITY)
Admission: EM | Admit: 2012-02-03 | Discharge: 2012-02-03 | Disposition: A | Discharge status: Home / Self Care | Payer: Self-pay | Source: Home / Self Care

## 2012-02-03 ENCOUNTER — Encounter (HOSPITAL_COMMUNITY): Payer: Self-pay | Admitting: *Deleted

## 2012-02-03 DIAGNOSIS — Z23 Encounter for immunization: Secondary | ICD-10-CM

## 2012-02-03 DIAGNOSIS — I10 Essential (primary) hypertension: Secondary | ICD-10-CM

## 2012-02-03 LAB — COMPREHENSIVE METABOLIC PANEL
ALT: 14 U/L (ref 0–35)
AST: 22 U/L (ref 0–37)
Albumin: 3.7 g/dL (ref 3.5–5.2)
BUN: 13 mg/dL (ref 6–23)
CO2: 28 mEq/L (ref 19–32)
Chloride: 106 mEq/L (ref 96–112)
Potassium: 4.1 mEq/L (ref 3.5–5.1)
Total Bilirubin: 0.2 mg/dL — ABNORMAL LOW (ref 0.3–1.2)
Total Protein: 7.5 g/dL (ref 6.0–8.3)

## 2012-02-03 LAB — LIPID PANEL
Cholesterol: 213 mg/dL — ABNORMAL HIGH (ref 0–200)
HDL: 64 mg/dL (ref >39)
LDL Cholesterol: 132 mg/dL — ABNORMAL HIGH (ref 0–99)
Total CHOL/HDL Ratio: 3.3 RATIO
VLDL: 17 mg/dL (ref 0–40)

## 2012-02-03 MED ORDER — METOPROLOL TARTRATE 50 MG PO TABS: 50.0000 mg | ORAL_TABLET | Freq: Every day | ORAL | Status: DC

## 2012-02-03 MED ORDER — INFLUENZA VIRUS VACC SPLIT PF IM SUSP
0.5000 mL | Freq: Once | INTRAMUSCULAR | Status: AC
  Administered 2012-02-03: 0.5 mL via INTRAMUSCULAR

## 2012-02-03 MED ORDER — METOPROLOL SUCCINATE ER 50 MG PO TB24: 50.0000 mg | ORAL_TABLET | Freq: Every day | ORAL | Status: DC

## 2012-02-03 MED ORDER — LOSARTAN POTASSIUM-HCTZ 100-12.5 MG PO TABS: 1.0000 | ORAL_TABLET | Freq: Every day | ORAL | Status: DC

## 2012-02-03 NOTE — ED Provider Notes (Signed)
History     CSN: [id number]  Arrival date & time 02/03/12  1752   None     Chief Complaint  Patient presents with  . Hypertension  . Medication Refill   (Consider location/radiation/quality/duration/timing/severity/associated sxs/prior treatment) HPI  Pt has severe hypertension.  Pt out of her BP meds.  She says she has been out of her metoprolol for the past several days.  She denies chest pain and SOB.  She has a history of difficult to control HTN. She says that she feels better after her pastor gave her some of his losartan tablets.  She says that she had BP of 214/100 this weekend before she was able to get the losartan tablets.  She says that she has not been able to see her cardiologist because of having no insurance.  She is reporting that she is not aware of any history of heart disease but has a history of a heart murmur.   Past Medical History  Diagnosis Date  . Hypertension   . Calcium deficiency   . Fibromyalgia   . Heart murmur     History reviewed. No pertinent past surgical history.  Family History  Problem Relation Age of Onset  . Family history unknown: Yes    History  Substance Use Topics  . Smoking status: Never Smoker   . Smokeless tobacco: Not on file  . Alcohol Use: No    OB History    Grav Para Term Preterm Abortions TAB SAB Ect Mult Living                  Review of Systems  Constitutional: Negative.   HENT: Negative.   Eyes: Negative.   Respiratory: Negative.   Cardiovascular: Negative.   Gastrointestinal: Negative.   Musculoskeletal: Negative.   Neurological: Negative.   Psychiatric/Behavioral: Negative.     Allergies  Review of patient's allergies indicates no known allergies.  Home Medications   Current Outpatient Rx  Name  Route  Sig  Dispense  Refill  . ASPIRIN EC 81 MG PO TBEC   Oral   Take 81 mg by mouth daily.         Marland Kitchen LOSARTAN POTASSIUM 100 MG PO TABS   Oral   Take 1 tablet (100 mg total) by mouth daily.  30 tablet   2     Home med.   Marland Kitchen METOPROLOL TARTRATE 50 MG PO TABS   Oral   Take 1 tablet (50 mg total) by mouth once.   30 tablet   2     Previous home med.   . TOBRAMYCIN SULFATE 0.3 % OP SOLN   Left Eye   Place 1 drop into the left eye daily.           BP 181/86  Pulse 91  Temp 98.3 F (36.8 C) (Oral)  Resp 18  SpO2 100%  Physical Exam  Constitutional: She is oriented to person, place, and time. She appears well-developed and well-nourished. She appears distressed.  HENT:  Head: Normocephalic.  Nose: Mucosal edema and rhinorrhea present. No sinus tenderness.  Eyes: Conjunctivae normal and EOM are normal. Pupils are equal, round, and reactive to light.  Neck: Normal range of motion. Neck supple. No JVD present. No thyromegaly present.  Cardiovascular: Normal rate, regular rhythm and normal heart sounds.   Pulmonary/Chest: Effort normal and breath sounds normal.  Abdominal: Soft. Bowel sounds are normal.  Musculoskeletal: Normal range of motion.  Lymphadenopathy:    She has cervical  adenopathy.  Neurological: She is alert and oriented to person, place, and time. She has normal reflexes.  Skin: Skin is warm and dry. No rash noted. No erythema.  Psychiatric: She has a normal mood and affect.    ED Course  Procedures (including critical care time)  Labs Reviewed - No data to display No results found.   No diagnosis found.    MDM  Difficult to control Hypertension - per history  I made some changes to the BP med regimen today to include Hyzaar 100/12.5 po daily plus metoprolol succinate 50 mg po daily.  Follow up in 3 weeks for recheck of BP and further titration of BP meds and further management.  Ordered CMP, lipid panel today and TSH as pt has been having hot flushes.    Return to clinic or go to ER if symptoms worsen or new problems develop.   Cleora Fleet, MD, CDE, FAAFP [facility] [place], [place]        Cleora Fleet, MD 02/03/12 [phone]

## 2012-02-03 NOTE — ED Notes (Signed)
Pt reports needing htn med refills and primary md

## 2012-02-04 ENCOUNTER — Encounter (HOSPITAL_COMMUNITY): Payer: Self-pay | Admitting: Family Medicine

## 2012-02-04 DIAGNOSIS — I10 Essential (primary) hypertension: Secondary | ICD-10-CM

## 2012-02-04 DIAGNOSIS — R011 Cardiac murmur, unspecified: Secondary | ICD-10-CM | POA: Insufficient documentation

## 2012-02-04 DIAGNOSIS — E785 Hyperlipidemia, unspecified: Secondary | ICD-10-CM | POA: Insufficient documentation

## 2012-02-04 DIAGNOSIS — M797 Fibromyalgia: Secondary | ICD-10-CM

## 2012-02-04 LAB — TSH: TSH: 0.856 u[IU]/mL (ref 0.350–4.500)

## 2012-02-04 NOTE — Progress Notes (Signed)
Quick Note:  TSH test came back within normal limits. Recommend retesting it in 4-6 months. Continue current dose of thyroid replacement medication.   Rodney Langton, MD, CDE, FAAFP [facility] [place], [place]   ______

## 2012-02-11 ENCOUNTER — Other Ambulatory Visit: Payer: Self-pay | Admitting: Family Medicine

## 2012-02-11 DIAGNOSIS — Z1231 Encounter for screening mammogram for malignant neoplasm of breast: Secondary | ICD-10-CM

## 2012-02-24 ENCOUNTER — Emergency Department (INDEPENDENT_AMBULATORY_CARE_PROVIDER_SITE_OTHER)
Admission: EM | Admit: 2012-02-24 | Discharge: 2012-02-24 | Disposition: A | Discharge status: Home / Self Care | Payer: Self-pay | Source: Home / Self Care

## 2012-02-24 ENCOUNTER — Encounter (HOSPITAL_COMMUNITY): Payer: Self-pay

## 2012-02-24 DIAGNOSIS — I10 Essential (primary) hypertension: Secondary | ICD-10-CM

## 2012-02-24 DIAGNOSIS — E785 Hyperlipidemia, unspecified: Secondary | ICD-10-CM

## 2012-02-24 NOTE — ED Notes (Signed)
Follow up.

## 2012-02-24 NOTE — ED Provider Notes (Signed)
History    CSN: [id number]  Arrival date & time 02/24/12  1750   Chief Complaint  Patient presents with  . Follow-up   HPI Pt presents today for follow up of her hypertension.  She reports that she was able to get her blood pressure medications filled but the cost was expensive.  The patient reports that she is not having any side effects from the medication.  She reports that she is not having any chest pain or shortness of breath.  She reports that she is starting a weight loss program at the beginning of the New Year 2014.  Past Medical History  Diagnosis Date  . Hypertension   . Calcium deficiency   . Fibromyalgia   . Heart murmur   . Hyperlipidemia     History reviewed. No pertinent past surgical history.  No family history on file.  History  Substance Use Topics  . Smoking status: Never Smoker   . Smokeless tobacco: Not on file  . Alcohol Use: No    OB History    Grav Para Term Preterm Abortions TAB SAB Ect Mult Living                  Review of Systems  Constitutional: Positive for fatigue.  Musculoskeletal: Positive for back pain and arthralgias.  All other systems reviewed and are negative.    Allergies  Review of patient's allergies indicates no known allergies.  Home Medications   Current Outpatient Rx  Name  Route  Sig  Dispense  Refill  . ASPIRIN EC 81 MG PO TBEC   Oral   Take 81 mg by mouth daily.         Marland Kitchen LOSARTAN POTASSIUM-HCTZ 100-12.5 MG PO TABS   Oral   Take 1 tablet by mouth daily.   30 tablet   3   . METOPROLOL SUCCINATE ER 50 MG PO TB24   Oral   Take 1 tablet (50 mg total) by mouth daily. Take with or immediately following a meal.   30 tablet   3     BP 122/60  Pulse 83  Temp 97.9 F (36.6 C) (Oral)  Resp 19  SpO2 100%  Physical Exam  Nursing note and vitals reviewed. Constitutional: She is oriented to person, place, and time. She appears well-developed and well-nourished. No distress.  HENT:  Head:  Normocephalic and atraumatic.  Eyes: EOM are normal. Pupils are equal, round, and reactive to light.  Neck: Normal range of motion. Neck supple.  Cardiovascular: Normal rate, regular rhythm and normal heart sounds.   Pulmonary/Chest: Effort normal and breath sounds normal. No respiratory distress. She has no wheezes. She has no rales.  Abdominal: Soft. Bowel sounds are normal.  Musculoskeletal: Normal range of motion.  Neurological: She is alert and oriented to person, place, and time.  Skin: Skin is warm and dry.  Psychiatric: She has a normal mood and affect. Her behavior is normal. Judgment and thought content normal.    ED Course  Procedures (including critical care time)  Labs Reviewed - No data to display No results found.  No diagnosis found.  MDM  IMPRESSION  Hypertension - much better controlled now on meds  Obesity  RECOMMENDATIONS / PLAN  Continue current meds, continue diet and exercise and follow up in 3 months for labs.  At that time recommend testing thyroid again metabolic panel and lipids.    FOLLOW UP 3 months for recheck  The patient was given clear instructions  to go to ER or return to medical center if symptoms don't improve, worsen or new problems develop.  The patient verbalized understanding.  The patient was told to call to get lab results if they haven't heard anything in the next week.            Cleora Fleet, MD 02/24/12 1950

## 2012-03-05 ENCOUNTER — Ambulatory Visit (HOSPITAL_COMMUNITY)
Admission: RE | Admit: 2012-03-05 | Discharge: 2012-03-05 | Disposition: A | Discharge status: Home / Self Care | Payer: Self-pay | Source: Ambulatory Visit | Attending: Family Medicine | Admitting: Family Medicine

## 2012-03-05 DIAGNOSIS — Z1231 Encounter for screening mammogram for malignant neoplasm of breast: Secondary | ICD-10-CM

## 2012-06-03 ENCOUNTER — Encounter (HOSPITAL_COMMUNITY): Payer: Self-pay

## 2012-06-03 ENCOUNTER — Emergency Department (HOSPITAL_COMMUNITY)
Admission: EM | Admit: 2012-06-03 | Discharge: 2012-06-03 | Disposition: A | Discharge status: Home / Self Care | Payer: Self-pay | Source: Home / Self Care

## 2012-06-03 DIAGNOSIS — I10 Essential (primary) hypertension: Secondary | ICD-10-CM

## 2012-06-03 MED ORDER — METOPROLOL SUCCINATE ER 100 MG PO TB24: 100.0000 mg | ORAL_TABLET | Freq: Every day | ORAL | Status: DC

## 2012-06-03 MED ORDER — LOSARTAN POTASSIUM-HCTZ 100-12.5 MG PO TABS: 1.0000 | ORAL_TABLET | Freq: Every day | ORAL | Status: DC

## 2012-06-03 NOTE — ED Notes (Signed)
Patient Demographics  Paula Alexander, is a 57 y.o. female  ZOX:[id number]  [id number]:[id number]  DOB - [birth year]  Chief Complaint  Patient presents with  . Medication Refill        Subjective:   [name] history of hypertension here for routine followup visit and get blood pressure medications refilled, no subjective complaints, no fever chills, no headache, no chest abdominal pain, no cough shortness of breath, no blood in stool or urine, no dysuria.  Objective:    Filed Vitals:   06/03/12 1206  BP: 151/62  Pulse: 87  Temp: 97.7 F (36.5 C)  TempSrc: Oral  SpO2: 99%     Exam  Awake Alert, Oriented X 3, No new F.N deficits, Normal affect [place].AT,PERRAL Supple Neck,No JVD, No cervical lymphadenopathy appriciated.  Symmetrical Chest wall movement, Good air movement bilaterally, CTAB RRR,No Gallops,Rubs or new Murmurs, No Parasternal Heave +ve B.Sounds, Abd Soft, Non tender, No organomegaly appriciated, No rebound - guarding or rigidity. No Cyanosis, Clubbing or edema, No new Rash or bruise      Data Review   CBC No results found for this basename: WBC, HGB, HCT, PLT, MCV, MCH, MCHC, RDW, NEUTRABS, LYMPHSABS, MONOABS, EOSABS, BASOSABS, BANDABS, BANDSABD,  in the last 168 hours  Chemistries   No results found for this basename: NA, K, CL, CO2, GLUCOSE, BUN, CREATININE, GFRCGP, CALCIUM, MG, AST, ALT, ALKPHOS, BILITOT,  in the last 168 hours ------------------------------------------------------------------------------------------------------------------ No results found for this basename: HGBA1C,  in the last 72 hours ------------------------------------------------------------------------------------------------------------------ No results found for this basename: CHOL, HDL, LDLCALC, TRIG, CHOLHDL, LDLDIRECT,  in the last 72 hours ------------------------------------------------------------------------------------------------------------------ No results found  for this basename: TSH, T4TOTAL, FREET3, T3FREE, THYROIDAB,  in the last 72 hours ------------------------------------------------------------------------------------------------------------------ No results found for this basename: VITAMINB12, FOLATE, FERRITIN, TIBC, IRON, RETICCTPCT,  in the last 72 hours  Coagulation profile  No results found for this basename: INR, PROTIME,  in the last 168 hours     Prior to Admission medications   Medication Sig Start Date End Date Taking? Authorizing Provider  aspirin EC 81 MG tablet Take 81 mg by mouth daily.    Historical Provider, MD  losartan-hydrochlorothiazide (HYZAAR) 100-12.5 MG per tablet Take 1 tablet by mouth daily. 06/03/12   Leroy Sea, MD  metoprolol succinate (TOPROL XL) 100 MG 24 hr tablet Take 1 tablet (100 mg total) by mouth daily. Take with or immediately following a meal. 06/03/12   Leroy Sea, MD     Assessment & Plan   HTN. Somewhat poor control, increase her beta blocker dose, will come back in a month for blood pressure recheck, will get her day before to get CMP and lipid panel checked.    Follow-up Information   Follow up with this clinic. Schedule an appointment as soon as possible for a visit in 1 month. (Him back back a day before your next visit empty stomach for blood work)        Leroy Sea M.D on 06/03/2012 at 12:15 PM   Leroy Sea, MD 06/03/12 1216

## 2012-06-03 NOTE — ED Notes (Signed)
Patient has high blood pressure Needs medication refill

## 2012-06-22 ENCOUNTER — Emergency Department (HOSPITAL_COMMUNITY)
Admission: EM | Admit: 2012-06-22 | Discharge: 2012-06-22 | Disposition: A | Discharge status: Home / Self Care | Payer: No Typology Code available for payment source | Source: Home / Self Care | Attending: Family Medicine | Admitting: Family Medicine

## 2012-06-22 ENCOUNTER — Encounter (HOSPITAL_COMMUNITY): Payer: Self-pay

## 2012-06-22 DIAGNOSIS — E785 Hyperlipidemia, unspecified: Secondary | ICD-10-CM

## 2012-06-22 DIAGNOSIS — I10 Essential (primary) hypertension: Secondary | ICD-10-CM

## 2012-06-22 DIAGNOSIS — H3500 Unspecified background retinopathy: Secondary | ICD-10-CM | POA: Insufficient documentation

## 2012-06-22 DIAGNOSIS — I739 Peripheral vascular disease, unspecified: Secondary | ICD-10-CM | POA: Insufficient documentation

## 2012-06-22 DIAGNOSIS — M797 Fibromyalgia: Secondary | ICD-10-CM

## 2012-06-22 MED ORDER — METOPROLOL SUCCINATE ER 50 MG PO TB24: 100.0000 mg | ORAL_TABLET | Freq: Every day | ORAL | Status: DC

## 2012-06-22 MED ORDER — LOSARTAN POTASSIUM-HCTZ 100-12.5 MG PO TABS: 1.0000 | ORAL_TABLET | Freq: Every day | ORAL | Status: DC

## 2012-06-22 NOTE — ED Provider Notes (Addendum)
History   CSN: [id number]  Arrival date & time 06/22/12  1744   First MD Initiated Contact with Patient 06/22/12 1907      Chief Complaint  Patient presents with  . Follow-up   HPI The patient reports she was evaluated by her ophthalmologist for retinopathy and he noticed that she had an abnormality that was concerning in one of her eyes.  She had an area of vascular occlusion in her retinal artery that suggested embolic plaque formation.  He would like to have her evaluated with a carotid Doppler study.  The patient reports she's has no difficulty with her vision.  The patient reports that she otherwise has been doing fairly well.  She reports that she has not experienced any problems with her blood pressure medications.  No chest pain or shortness of breath and no palpitations reported.  Pt says that she never increased her metoprolol to 100 mg as suggested to her at her last visit.  Past Medical History  Diagnosis Date  . Hypertension   . Calcium deficiency   . Fibromyalgia   . Heart murmur   . Hyperlipidemia     History reviewed. No pertinent past surgical history.  Family History Hypertension  History  Substance Use Topics  . Smoking status: Never Smoker   . Smokeless tobacco: Not on file  . Alcohol Use: No    OB History   Grav Para Term Preterm Abortions TAB SAB Ect Mult Living                  Review of Systems Constitutional: Negative.  HENT: Negative.  Respiratory: Negative.  Cardiovascular: Negative.  Gastrointestinal: Negative.  Endocrine: Negative.  Genitourinary: Negative.  Musculoskeletal: Negative.  Skin: Negative.  Allergic/Immunologic: Negative.  Neurological: Negative.  Hematological: Negative.  Psychiatric/Behavioral: Negative.  All other systems reviewed and are negative   Allergies  Review of patient's allergies indicates no known allergies.  Home Medications   Current Outpatient Rx  Name  Route  Sig  Dispense  Refill  . aspirin EC  81 MG tablet   Oral   Take 81 mg by mouth daily.         Marland Kitchen losartan-hydrochlorothiazide (HYZAAR) 100-12.5 MG per tablet   Oral   Take 1 tablet by mouth daily.   30 tablet   3   . metoprolol succinate (TOPROL XL) 100 MG 24 hr tablet   Oral   Take 1 tablet (100 mg total) by mouth daily. Take with or immediately following a meal.   30 tablet   3     BP 128/49  Pulse 75  Temp(Src) 98 F (36.7 C) (Oral)  Resp 18  SpO2 100%  Physical Exam Nursing note and vitals reviewed.  Constitutional: She is oriented to person, place, and time. She appears well-developed and well-nourished. No distress.  HENT:  Head: Normocephalic and atraumatic.  Eyes: Conjunctivae and EOM are normal. Pupils are equal, round, and reactive to light.  Neck: Normal range of motion. Neck supple. No JVD present. No tracheal deviation present. No thyromegaly present.  Cardiovascular: Normal rate, regular rhythm and normal heart sounds.  Pulmonary/Chest: Effort normal and breath sounds normal. No respiratory distress. She has no wheezes.  Abdominal: Soft. Bowel sounds are normal.  Musculoskeletal: Normal range of motion. She exhibits no edema and no tenderness.  Lymphadenopathy:  She has no cervical adenopathy.  Neurological: She is alert and oriented to person, place, and time. She has normal reflexes.  Skin: Skin is  warm and dry.  Psychiatric: She has a normal mood and affect. Her behavior is normal. Judgment and thought content normal.    ED Course  Procedures (including critical care time)  Labs Reviewed - No data to display No results found.   No diagnosis found.  MDM  IMPRESSION  Hyperlipidemia  Hypertension  PVD  Retinopathy  Fibromyalgia  Embolic retinal artery occlusion  RECOMMENDATIONS / PLAN Check a carotid Doppler study to rule out peripheral vascular disease Continue current medications for blood pressure as it is currently controlled Discussed high cholesterol diet.   Patient is going to try diet and exercise.  If no improvement recommend statin therapy. Continue daily aspirin  Continue metoprolol ER 50 mg po daily as pt never took the increased dose and her BP is doing well now  FOLLOW UP 3 months   The patient was given clear instructions to go to ER or return to medical center if symptoms don't improve, worsen or new problems develop.  The patient verbalized understanding.  The patient was told to call to get lab results if they haven't heard anything in the next week.          Cleora Fleet, MD 06/22/12 2027  Cleora Fleet, MD 06/23/12 4098  Cleora Fleet, MD 06/23/12 [phone]

## 2012-06-22 NOTE — ED Notes (Signed)
Follow up HTN 

## 2012-06-23 MED ORDER — METOPROLOL SUCCINATE ER 50 MG PO TB24: 50.0000 mg | ORAL_TABLET | Freq: Every day | ORAL | Status: DC

## 2012-06-25 ENCOUNTER — Ambulatory Visit (HOSPITAL_COMMUNITY): Payer: No Typology Code available for payment source

## 2012-07-02 ENCOUNTER — Ambulatory Visit (HOSPITAL_COMMUNITY)
Admission: RE | Admit: 2012-07-02 | Discharge: 2012-07-02 | Disposition: A | Discharge status: Home / Self Care | Payer: No Typology Code available for payment source | Source: Ambulatory Visit | Attending: Family Medicine | Admitting: Family Medicine

## 2012-07-02 DIAGNOSIS — E785 Hyperlipidemia, unspecified: Secondary | ICD-10-CM

## 2012-07-02 DIAGNOSIS — H3500 Unspecified background retinopathy: Secondary | ICD-10-CM | POA: Insufficient documentation

## 2012-07-02 DIAGNOSIS — H349 Unspecified retinal vascular occlusion: Secondary | ICD-10-CM | POA: Insufficient documentation

## 2012-07-02 NOTE — Progress Notes (Signed)
Bilateral carotid artery duplex:  No evidence of hemodynamically significant internal carotid artery stenosis.   Vertebral artery flow is antegrade.     

## 2012-07-06 NOTE — ED Notes (Signed)
[facility] They did not have her bp med per dr Gonzella Lex ok to to change to lisinopril 40 with HCTZ 12.5 Metoprolol changed to 50 mg daily per dr Laural Benes note in chart

## 2012-11-16 ENCOUNTER — Telehealth: Payer: Self-pay

## 2012-11-16 NOTE — Telephone Encounter (Signed)
[facility] needs new script for Toprol XL 50 mg Can we print and needs to be faxed

## 2012-11-17 MED ORDER — METOPROLOL SUCCINATE ER 50 MG PO TB24: 50.0000 mg | ORAL_TABLET | Freq: Every day | ORAL | Status: DC

## 2012-11-17 NOTE — Telephone Encounter (Signed)
I have printed and faxed new prescription to [facility].

## 2012-11-17 NOTE — Addendum Note (Signed)
Addended by: Jeanann Lewandowsky E on: 11/17/2012 11:13 AM   Modules accepted: Orders

## 2013-02-09 ENCOUNTER — Telehealth: Payer: Self-pay | Admitting: Emergency Medicine

## 2013-02-09 ENCOUNTER — Ambulatory Visit: Payer: Self-pay

## 2013-02-09 MED ORDER — HYDROCHLOROTHIAZIDE 25 MG PO TABS: 25.0000 mg | ORAL_TABLET | Freq: Every day | ORAL | Status: DC

## 2013-02-09 MED ORDER — LISINOPRIL 40 MG PO TABS: 40.0000 mg | ORAL_TABLET | Freq: Every day | ORAL | Status: DC

## 2013-02-09 NOTE — Telephone Encounter (Signed)
Pt comes requesting BP refills on Lisinopril 40 mg tab and HCTZ 25 mg tab. Pt was full warning she will be given #30 day supply with no refills only this time, until she comes in for office visit. Pt has not been seen here in clinic.

## 2013-02-25 ENCOUNTER — Ambulatory Visit: Payer: No Typology Code available for payment source | Attending: Internal Medicine

## 2013-03-04 ENCOUNTER — Ambulatory Visit: Payer: No Typology Code available for payment source | Attending: Internal Medicine | Admitting: Internal Medicine

## 2013-03-04 ENCOUNTER — Encounter: Payer: Self-pay | Admitting: Internal Medicine

## 2013-03-04 VITALS — BP 125/76 | HR 85 | Temp 98.6°F | Resp 16 | Wt 202.6 lb

## 2013-03-04 DIAGNOSIS — Z139 Encounter for screening, unspecified: Secondary | ICD-10-CM

## 2013-03-04 DIAGNOSIS — E785 Hyperlipidemia, unspecified: Secondary | ICD-10-CM

## 2013-03-04 DIAGNOSIS — I10 Essential (primary) hypertension: Secondary | ICD-10-CM | POA: Insufficient documentation

## 2013-03-04 MED ORDER — LOSARTAN POTASSIUM-HCTZ 100-25 MG PO TABS: 1.0000 | ORAL_TABLET | Freq: Every day | ORAL | Status: DC

## 2013-03-04 NOTE — Progress Notes (Signed)
Patient here for follow up -HTN Would like her medication changed back to what she used to take Which was losartan and metoprolol

## 2013-03-04 NOTE — Progress Notes (Signed)
MRN: [id number] Name: Paula Alexander  Sex: female Age: 57 y.o. DOB: [birth year]  Allergies: Review of patient's allergies indicates no known allergies.  Chief Complaint  Patient presents with  . Medication Refill    HPI: Patient is 57 y.o. female who comes for the for followup history of hypertension, patient is requesting blood pressure medication, she used to be on metoprolol, losartan/HCTZ combination in the past but currently taking lisinopril hydrochlorothiazide and metoprolol, she denies any chest symptoms has not had any blood work done recently. Patient denies any chest pain shortness of breath any orthopnea PND.  Past Medical History  Diagnosis Date  . Hypertension   . Calcium deficiency   . Fibromyalgia   . Heart murmur   . Hyperlipidemia     History reviewed. No pertinent past surgical history.    Medication List       This list is accurate as of: 03/04/13  3:12 PM.  Always use your most recent med list.               aspirin EC 81 MG tablet  Take 81 mg by mouth daily.     losartan-hydrochlorothiazide 100-25 MG per tablet  Commonly known as:  HYZAAR  Take 1 tablet by mouth daily.     metoprolol succinate 50 MG 24 hr tablet  Commonly known as:  TOPROL-XL  Take 50 mg by mouth daily. Take with or immediately following a meal.        Meds ordered this encounter  Medications  . metoprolol succinate (TOPROL-XL) 50 MG 24 hr tablet    Sig: Take 50 mg by mouth daily. Take with or immediately following a meal.  . losartan-hydrochlorothiazide (HYZAAR) 100-25 MG per tablet    Sig: Take 1 tablet by mouth daily.    Dispense:  90 tablet    Refill:  3    Immunization History  Administered Date(s) Administered  . Influenza Split 02/03/2012    History reviewed. No pertinent family history.  History  Substance Use Topics  . Smoking status: Never Smoker   . Smokeless tobacco: Not on file  . Alcohol Use: No    Review of Systems  As noted in  HPI  Filed Vitals:   03/04/13 1458  BP: 125/76  Pulse: 85  Temp: 98.6 F (37 C)  Resp: 16    Physical Exam  Physical Exam  Constitutional: No distress.  Eyes: EOM are normal. Pupils are equal, round, and reactive to light.  Cardiovascular: Normal rate and regular rhythm.   Pulmonary/Chest: Breath sounds normal. No respiratory distress. She has no wheezes. She has no rales.  Musculoskeletal: She exhibits no edema.    CBC No results found for this basename: wbc, rbc, hgb, hct, plt, mcv, neutrabs, lymphsabs, monoabs, eosabs, basosabs    CMP     Component Value Date/Time   NA 143 02/03/2012 1844   K 4.1 02/03/2012 1844   CL 106 02/03/2012 1844   CO2 28 02/03/2012 1844   GLUCOSE 90 02/03/2012 1844   BUN 13 02/03/2012 1844   CREATININE 0.78 02/03/2012 1844   CALCIUM 9.9 02/03/2012 1844   PROT 7.5 02/03/2012 1844   ALBUMIN 3.7 02/03/2012 1844   AST 22 02/03/2012 1844   ALT 14 02/03/2012 1844   ALKPHOS 113 02/03/2012 1844   BILITOT 0.2* 02/03/2012 1844   GFRNONAA >90 02/03/2012 1844   GFRAA >90 02/03/2012 1844    Lab Results  Component Value Date/Time   CHOL 213* 02/03/2012  6:44  PM    No components found with this basename: hga1c    Lab Results  Component Value Date/Time   AST 22 02/03/2012  6:44 PM    Assessment and Plan  Hypertension - Plan: Prescribed her losartan-hydrochlorothiazide (HYZAAR) 100-25 MG per tablet combination continue with Toprol-XL 50 mg, advise for low salt diet.  Hyperlipidemia - Plan: Will repeat Lipid panel patient is not on any medications.  Screening - Plan: CBC with Differential, COMPLETE METABOLIC PANEL WITH GFR, Vit D  25 hydroxy (rtn osteoporosis monitoring), TSH   Health Maintenance Patient declined flu shot  Return in about 6 weeks (around 04/15/2013).  Doris Cheadle, MD

## 2013-04-11 ENCOUNTER — Other Ambulatory Visit: Payer: Self-pay

## 2013-04-15 ENCOUNTER — Ambulatory Visit: Payer: Self-pay | Admitting: Internal Medicine

## 2013-05-20 ENCOUNTER — Other Ambulatory Visit: Payer: Self-pay | Admitting: Family Medicine

## 2013-05-24 ENCOUNTER — Ambulatory Visit: Payer: No Typology Code available for payment source

## 2013-05-24 ENCOUNTER — Ambulatory Visit: Payer: No Typology Code available for payment source | Attending: Internal Medicine

## 2013-05-24 DIAGNOSIS — E785 Hyperlipidemia, unspecified: Secondary | ICD-10-CM

## 2013-05-24 DIAGNOSIS — Z139 Encounter for screening, unspecified: Secondary | ICD-10-CM

## 2013-05-24 LAB — CBC WITH DIFFERENTIAL/PLATELET
BASOS ABS: 0 10*3/uL (ref 0.0–0.1)
BASOS PCT: 0 % (ref 0–1)
EOS ABS: 0.1 10*3/uL (ref 0.0–0.7)
Eosinophils Relative: 2 % (ref 0–5)
HCT: 32.6 % — ABNORMAL LOW (ref 36.0–46.0)
Hemoglobin: 10.6 g/dL — ABNORMAL LOW (ref 12.0–15.0)
Lymphocytes Relative: 38 % (ref 12–46)
Lymphs Abs: 2.3 10*3/uL (ref 0.7–4.0)
MCH: 27.9 pg (ref 26.0–34.0)
MCHC: 32.5 g/dL (ref 30.0–36.0)
MCV: 85.8 fL (ref 78.0–100.0)
Monocytes Absolute: 0.4 10*3/uL (ref 0.1–1.0)
Monocytes Relative: 6 % (ref 3–12)
NEUTROS ABS: 3.2 10*3/uL (ref 1.7–7.7)
NEUTROS PCT: 54 % (ref 43–77)
PLATELETS: 227 10*3/uL (ref 150–400)
RBC: 3.8 MIL/uL — ABNORMAL LOW (ref 3.87–5.11)
RDW: 14.6 % (ref 11.5–15.5)
WBC: 6 10*3/uL (ref 4.0–10.5)

## 2013-05-25 ENCOUNTER — Telehealth: Payer: Self-pay

## 2013-05-25 ENCOUNTER — Other Ambulatory Visit: Payer: Self-pay | Admitting: Internal Medicine

## 2013-05-25 DIAGNOSIS — D649 Anemia, unspecified: Secondary | ICD-10-CM

## 2013-05-25 LAB — COMPLETE METABOLIC PANEL WITH GFR
ALK PHOS: 62 U/L (ref 39–117)
ALT: 21 U/L (ref 0–35)
AST: 28 U/L (ref 0–37)
Albumin: 3.8 g/dL (ref 3.5–5.2)
BILIRUBIN TOTAL: 0.3 mg/dL (ref 0.2–1.2)
BUN: 14 mg/dL (ref 6–23)
CO2: 26 mEq/L (ref 19–32)
Calcium: 8.8 mg/dL (ref 8.4–10.5)
Chloride: 106 mEq/L (ref 96–112)
Creat: 0.89 mg/dL (ref 0.50–1.10)
GFR, EST NON AFRICAN AMERICAN: 72 mL/min
GFR, Est African American: 83 mL/min
GLUCOSE: 113 mg/dL — AB (ref 70–99)
POTASSIUM: 4.2 meq/L (ref 3.5–5.3)
SODIUM: 144 meq/L (ref 135–145)
TOTAL PROTEIN: 6.5 g/dL (ref 6.0–8.3)

## 2013-05-25 LAB — LIPID PANEL
CHOL/HDL RATIO: 4.2 ratio
Cholesterol: 178 mg/dL (ref 0–200)
HDL: 42 mg/dL (ref >39)
LDL Cholesterol: 116 mg/dL — ABNORMAL HIGH (ref 0–99)
Triglycerides: 101 mg/dL (ref <150)
VLDL: 20 mg/dL (ref 0–40)

## 2013-05-25 LAB — VITAMIN D 25 HYDROXY (VIT D DEFICIENCY, FRACTURES): VIT D 25 HYDROXY: 17 ng/mL — AB (ref 30–89)

## 2013-05-25 LAB — TSH: TSH: 0.772 u[IU]/mL (ref 0.350–4.500)

## 2013-05-25 MED ORDER — VITAMIN D (ERGOCALCIFEROL) 1.25 MG (50000 UNIT) PO CAPS: 50000.0000 [IU] | ORAL_CAPSULE | ORAL | Status: DC

## 2013-05-25 NOTE — Telephone Encounter (Signed)
Patient is aware of her lab results Prescription sent to our pharmacy

## 2013-05-25 NOTE — Telephone Encounter (Signed)
Message copied by Lestine MountJUAREZ, [place] Trawick L on Wed May 25, 2013 10:38 AM ------      Message from: Doris CheadleADVANI, DEEPAK      Created: Wed May 25, 2013 10:30 AM       Blood work reviewed, noticed low vitamin D, call patient advise to start ergocalciferol 50,000 units once a week for the duration of  12 weeks.      Also noticed low hemoglobin, I have ordered  anemia panel, advise patient to schedule appointment and do this blood work prior to next visit. ------

## 2013-05-26 ENCOUNTER — Ambulatory Visit: Payer: No Typology Code available for payment source | Attending: Internal Medicine

## 2013-05-26 DIAGNOSIS — D649 Anemia, unspecified: Secondary | ICD-10-CM

## 2013-05-27 ENCOUNTER — Ambulatory Visit: Payer: Self-pay

## 2013-05-27 ENCOUNTER — Ambulatory Visit: Payer: No Typology Code available for payment source | Attending: Internal Medicine | Admitting: Internal Medicine

## 2013-05-27 ENCOUNTER — Encounter: Payer: Self-pay | Admitting: Internal Medicine

## 2013-05-27 VITALS — BP 141/80 | HR 80 | Resp 15

## 2013-05-27 DIAGNOSIS — E559 Vitamin D deficiency, unspecified: Secondary | ICD-10-CM | POA: Insufficient documentation

## 2013-05-27 DIAGNOSIS — Z139 Encounter for screening, unspecified: Secondary | ICD-10-CM | POA: Insufficient documentation

## 2013-05-27 DIAGNOSIS — I10 Essential (primary) hypertension: Secondary | ICD-10-CM

## 2013-05-27 LAB — POCT GLYCOSYLATED HEMOGLOBIN (HGB A1C): Hemoglobin A1C: 5.9

## 2013-05-27 LAB — ANEMIA PANEL
%SAT: 18 % — AB (ref 20–55)
ABS RETIC: 57.5 10*3/uL (ref 19.0–186.0)
FERRITIN: 65 ng/mL (ref 10–291)
Folate: 13.4 ng/mL
IRON: 61 ug/dL (ref 42–145)
RBC.: 4.11 MIL/uL (ref 3.87–5.11)
Retic Ct Pct: 1.4 % (ref 0.4–2.3)
TIBC: 330 ug/dL (ref 250–470)
UIBC: 269 ug/dL (ref 125–400)
VITAMIN B 12: 677 pg/mL (ref 211–911)

## 2013-05-27 MED ORDER — VITAMIN D (ERGOCALCIFEROL) 1.25 MG (50000 UNIT) PO CAPS
50000.0000 [IU] | ORAL_CAPSULE | ORAL | Status: DC
Start: 2013-05-27 — End: 2015-04-06

## 2013-05-27 MED ORDER — LOSARTAN POTASSIUM-HCTZ 100-25 MG PO TABS: 1.0000 | ORAL_TABLET | Freq: Every day | ORAL | Status: DC

## 2013-05-27 MED ORDER — METOPROLOL SUCCINATE ER 50 MG PO TB24: 50.0000 mg | ORAL_TABLET | Freq: Every day | ORAL | Status: DC

## 2013-05-27 NOTE — Progress Notes (Signed)
Pt here for annual physical] Refused Pap Smear

## 2013-05-27 NOTE — Progress Notes (Signed)
Patient ID: DELCIE RUPPERT, female   DOB: 1955-04-08, 58 y.o.   MRN: [id number]   Brilyn Tuller, is a 58 y.o. female  [id number]  [id number]  DOB - [birth year]  Chief Complaint  Patient presents with  . Annual Exam        Subjective:   Nayab Aten is a 58 y.o. female here today for a follow up visit. Patient has history of hypertension, heart murmur, dyslipidemia and fibromyalgia as well as vitamin D deficiency on vitamin D supplement. She is here today for annual physical examination, she has no complaints. She is on losartan-hydrochlorothiazide, metoprolol and aspirin. She reports no side effects to medication, she claims her blood pressure is well under control. She is not known to be diabetic. She has not had colonoscopy, her last Pap smear was normal few years ago. Her last mammogram was normal in 2013 November, she'll be due for another one this year. She does not smoke cigarette, she does not drink alcohol. Patient has No headache, No chest pain, No abdominal pain - No Nausea, No new weakness tingling or numbness, No Cough - SOB.  Problem  Hypovitaminosis D  Screening    ALLERGIES: No Known Allergies  PAST MEDICAL HISTORY: Past Medical History  Diagnosis Date  . Hypertension   . Calcium deficiency   . Fibromyalgia   . Heart murmur   . Hyperlipidemia     MEDICATIONS AT HOME: Prior to Admission medications   Medication Sig Start Date End Date Taking? Authorizing Provider  aspirin EC 81 MG tablet Take 81 mg by mouth daily.    Historical Provider, MD  losartan-hydrochlorothiazide (HYZAAR) 100-25 MG per tablet Take 1 tablet by mouth daily. 05/27/13   Jeanann Lewandowsky, MD  metoprolol succinate (TOPROL-XL) 50 MG 24 hr tablet Take 1 tablet (50 mg total) by mouth daily. Take with or immediately following a meal. 05/27/13   Jeanann Lewandowsky, MD  Vitamin D, Ergocalciferol, (DRISDOL) 50000 UNITS CAPS capsule Take 1 capsule (50,000 Units total) by mouth every 7  (seven) days. 05/27/13   Jeanann Lewandowsky, MD     Objective:   Filed Vitals:   05/27/13 1057  BP: 141/80  Pulse: 80  Resp: 15    Exam General appearance : Awake, alert, not in any distress. Speech Clear. Not toxic looking HEENT: Atraumatic and Normocephalic, pupils equally reactive to light and accomodation Neck: supple, no JVD. No cervical lymphadenopathy.  Chest:Good air entry bilaterally, no added sounds  CVS: S1 S2 regular, no murmurs.  Abdomen: Bowel sounds present, Non tender and not distended with no gaurding, rigidity or rebound. Extremities: B/L Lower Ext shows no edema, both legs are warm to touch Neurology: Awake alert, and oriented X 3, CN II-XII intact, Non focal Skin:No Rash Wounds:N/A  Data Review No results found for this basename: HGBA1C     Assessment & Plan   1. Hypertension Refill - losartan-hydrochlorothiazide (HYZAAR) 100-25 MG per tablet; Take 1 tablet by mouth daily.  Dispense: 90 tablet; Refill: 3 - metoprolol succinate (TOPROL-XL) 50 MG 24 hr tablet; Take 1 tablet (50 mg total) by mouth daily. Take with or immediately following a meal.  Dispense: 90 tablet; Refill: 3  2. Screening  - Ambulatory referral to Gastroenterology - POCT glycosylated hemoglobin (Hb A1C) - HM COLONOSCOPY  3. Hypovitaminosis D Prescribed - Vitamin D, Ergocalciferol, (DRISDOL) 50000 UNITS CAPS capsule; Take 1 capsule (50,000 Units total) by mouth every 7 (seven) days.  Dispense: 12 capsule; Refill: 0  Patient was extensively counseled  on nutrition and exercise  Return in about 4 weeks (around 06/24/2013), or if symptoms worsen or fail to improve, for Pap Smear.  The patient was given clear instructions to go to ER or return to medical center if symptoms don't improve, worsen or new problems develop. The patient verbalized understanding. The patient was told to call to get lab results if they haven't heard anything in the next week.   This note has been created with  Education officer, environmentalDragon speech recognition software and smart phrase technology. Any transcriptional errors are unintentional.    Jeanann LewandowskyJEGEDE, Bennetta Rudden, MD, MHA, FACP, FAAP [facility] [place], [place] [phone]   05/27/2013, 11:04 AM

## 2013-06-10 ENCOUNTER — Ambulatory Visit: Payer: Self-pay

## 2013-06-21 ENCOUNTER — Telehealth: Payer: Self-pay | Admitting: Internal Medicine

## 2013-06-21 NOTE — Telephone Encounter (Signed)
Pt needs referral to OBGYN for pap because she needs an appt on a Fri for a pap smear.  Please f/u with pt when it has been sent.

## 2013-06-24 ENCOUNTER — Ambulatory Visit: Payer: No Typology Code available for payment source | Admitting: Internal Medicine

## 2013-06-27 ENCOUNTER — Other Ambulatory Visit: Payer: Self-pay

## 2013-06-27 ENCOUNTER — Encounter: Payer: Self-pay | Admitting: Internal Medicine

## 2013-06-27 ENCOUNTER — Telehealth: Payer: Self-pay

## 2013-06-27 DIAGNOSIS — Z0000 Encounter for general adult medical examination without abnormal findings: Secondary | ICD-10-CM

## 2013-06-27 NOTE — Telephone Encounter (Signed)
Left message on machine that the referral to OB-GYN has been put into epic

## 2013-10-21 ENCOUNTER — Ambulatory Visit: Payer: Self-pay

## 2014-06-22 ENCOUNTER — Other Ambulatory Visit: Payer: Self-pay | Admitting: Internal Medicine

## 2014-06-22 ENCOUNTER — Telehealth: Payer: Self-pay | Admitting: Internal Medicine

## 2014-06-22 NOTE — Telephone Encounter (Signed)
Pt would like to use our pharmacy instead .

## 2014-06-22 NOTE — Telephone Encounter (Signed)
Pt came into clinic requesting medication refill for metoprolol succinate (TOPROL-XL) 50 MG 24 hr tablet,losartan-hydrochlorothiazide (HYZAAR) 100-25 MG per tablet. Pt is completely out of medications, and had 1 refill left but its now expired. Please f/u with pt  Pt uses [facility].

## 2014-06-30 ENCOUNTER — Encounter: Payer: Self-pay | Admitting: Internal Medicine

## 2014-06-30 ENCOUNTER — Ambulatory Visit: Payer: Self-pay | Attending: Internal Medicine | Admitting: Internal Medicine

## 2014-06-30 VITALS — BP 128/83 | HR 79 | Temp 98.7°F | Resp 18 | Ht 62.5 in | Wt 187.8 lb

## 2014-06-30 DIAGNOSIS — E785 Hyperlipidemia, unspecified: Secondary | ICD-10-CM | POA: Insufficient documentation

## 2014-06-30 DIAGNOSIS — Z1231 Encounter for screening mammogram for malignant neoplasm of breast: Secondary | ICD-10-CM

## 2014-06-30 DIAGNOSIS — Z0000 Encounter for general adult medical examination without abnormal findings: Secondary | ICD-10-CM | POA: Insufficient documentation

## 2014-06-30 DIAGNOSIS — E559 Vitamin D deficiency, unspecified: Secondary | ICD-10-CM | POA: Insufficient documentation

## 2014-06-30 DIAGNOSIS — Z862 Personal history of diseases of the blood and blood-forming organs and certain disorders involving the immune mechanism: Secondary | ICD-10-CM

## 2014-06-30 DIAGNOSIS — Z139 Encounter for screening, unspecified: Secondary | ICD-10-CM | POA: Insufficient documentation

## 2014-06-30 DIAGNOSIS — I10 Essential (primary) hypertension: Secondary | ICD-10-CM | POA: Insufficient documentation

## 2014-06-30 DIAGNOSIS — R7301 Impaired fasting glucose: Secondary | ICD-10-CM

## 2014-06-30 DIAGNOSIS — Z1211 Encounter for screening for malignant neoplasm of colon: Secondary | ICD-10-CM

## 2014-06-30 LAB — COMPLETE METABOLIC PANEL WITH GFR
ALBUMIN: 3.8 g/dL (ref 3.5–5.2)
ALT: 21 U/L (ref 0–35)
AST: 23 U/L (ref 0–37)
Alkaline Phosphatase: 74 U/L (ref 39–117)
BUN: 24 mg/dL — AB (ref 6–23)
CHLORIDE: 105 meq/L (ref 96–112)
CO2: 27 mEq/L (ref 19–32)
Calcium: 9.5 mg/dL (ref 8.4–10.5)
Creat: 0.84 mg/dL (ref 0.50–1.10)
GFR, EST AFRICAN AMERICAN: 88 mL/min
GFR, Est Non African American: 76 mL/min
Glucose, Bld: 84 mg/dL (ref 70–99)
POTASSIUM: 3.8 meq/L (ref 3.5–5.3)
SODIUM: 142 meq/L (ref 135–145)
Total Bilirubin: 0.4 mg/dL (ref 0.2–1.2)
Total Protein: 6.7 g/dL (ref 6.0–8.3)

## 2014-06-30 LAB — ANEMIA PANEL
%SAT: 19 % — AB (ref 20–55)
ABS Retic: 47.3 10*3/uL (ref 19.0–186.0)
Ferritin: 64 ng/mL (ref 10–291)
Iron: 62 ug/dL (ref 42–145)
RBC.: 3.94 MIL/uL (ref 3.87–5.11)
RETIC CT PCT: 1.2 % (ref 0.4–2.3)
TIBC: 319 ug/dL (ref 250–470)
UIBC: 257 ug/dL (ref 125–400)
VITAMIN B 12: 763 pg/mL (ref 211–911)

## 2014-06-30 LAB — CBC WITH DIFFERENTIAL/PLATELET
BASOS ABS: 0 10*3/uL (ref 0.0–0.1)
BASOS PCT: 0 % (ref 0–1)
EOS PCT: 2 % (ref 0–5)
Eosinophils Absolute: 0.1 10*3/uL (ref 0.0–0.7)
HEMATOCRIT: 34.2 % — AB (ref 36.0–46.0)
Hemoglobin: 11 g/dL — ABNORMAL LOW (ref 12.0–15.0)
LYMPHS PCT: 31 % (ref 12–46)
Lymphs Abs: 1.6 10*3/uL (ref 0.7–4.0)
MCH: 27.9 pg (ref 26.0–34.0)
MCHC: 32.2 g/dL (ref 30.0–36.0)
MCV: 86.8 fL (ref 78.0–100.0)
MPV: 10.9 fL (ref 8.6–12.4)
Monocytes Absolute: 0.4 10*3/uL (ref 0.1–1.0)
Monocytes Relative: 7 % (ref 3–12)
Neutro Abs: 3.2 10*3/uL (ref 1.7–7.7)
Neutrophils Relative %: 60 % (ref 43–77)
PLATELETS: 204 10*3/uL (ref 150–400)
RBC: 3.94 MIL/uL (ref 3.87–5.11)
RDW: 13.9 % (ref 11.5–15.5)
WBC: 5.3 10*3/uL (ref 4.0–10.5)

## 2014-06-30 LAB — TSH: TSH: 0.483 u[IU]/mL (ref 0.350–4.500)

## 2014-06-30 LAB — HEMOGLOBIN A1C
HEMOGLOBIN A1C: 6.3 % — AB (ref <5.7)
Mean Plasma Glucose: 134 mg/dL — ABNORMAL HIGH (ref <117)

## 2014-06-30 MED ORDER — METOPROLOL SUCCINATE ER 50 MG PO TB24: ORAL_TABLET | ORAL | Status: DC

## 2014-06-30 MED ORDER — LOSARTAN POTASSIUM-HCTZ 100-25 MG PO TABS: 1.0000 | ORAL_TABLET | Freq: Every day | ORAL | Status: DC

## 2014-06-30 NOTE — Patient Instructions (Addendum)
DASH Eating Plan DASH stands for "Dietary Approaches to Stop Hypertension." The DASH eating plan is a healthy eating plan that has been shown to reduce high blood pressure (hypertension). Additional health benefits may include reducing the risk of type 2 diabetes mellitus, heart disease, and stroke. The DASH eating plan may also help with weight loss. WHAT DO I NEED TO KNOW ABOUT THE DASH EATING PLAN? For the DASH eating plan, you will follow these general guidelines:  Choose foods with a percent daily value for sodium of less than 5% (as listed on the food label).  Use salt-free seasonings or herbs instead of table salt or sea salt.  Check with your health care provider or pharmacist before using salt substitutes.  Eat lower-sodium products, often labeled as "lower sodium" or "no salt added."  Eat fresh foods.  Eat more vegetables, fruits, and low-fat dairy products.  Choose whole grains. Look for the word "whole" as the first word in the ingredient list.  Choose fish and skinless chicken or turkey more often than red meat. Limit fish, poultry, and meat to 6 oz (170 g) each day.  Limit sweets, desserts, sugars, and sugary drinks.  Choose heart-healthy fats.  Limit cheese to 1 oz (28 g) per day.  Eat more home-cooked food and less restaurant, buffet, and fast food.  Limit fried foods.  Cook foods using methods other than frying.  Limit canned vegetables. If you do use them, rinse them well to decrease the sodium.  When eating at a restaurant, ask that your food be prepared with less salt, or no salt if possible. WHAT FOODS CAN I EAT? Seek help from a dietitian for individual calorie needs. Grains Whole grain or whole wheat bread. Brown rice. Whole grain or whole wheat pasta. Quinoa, bulgur, and whole grain cereals. Low-sodium cereals. Corn or whole wheat flour tortillas. Whole grain cornbread. Whole grain crackers. Low-sodium crackers. Vegetables Fresh or frozen vegetables  (raw, steamed, roasted, or grilled). Low-sodium or reduced-sodium tomato and vegetable juices. Low-sodium or reduced-sodium tomato sauce and paste. Low-sodium or reduced-sodium canned vegetables.  Fruits All fresh, canned (in natural juice), or frozen fruits. Meat and Other Protein Products Ground beef (85% or leaner), grass-fed beef, or beef trimmed of fat. Skinless chicken or turkey. Ground chicken or turkey. Pork trimmed of fat. All fish and seafood. Eggs. Dried beans, peas, or lentils. Unsalted nuts and seeds. Unsalted canned beans. Dairy Low-fat dairy products, such as skim or 1% milk, 2% or reduced-fat cheeses, low-fat ricotta or cottage cheese, or plain low-fat yogurt. Low-sodium or reduced-sodium cheeses. Fats and Oils Tub margarines without trans fats. Light or reduced-fat mayonnaise and salad dressings (reduced sodium). Avocado. Safflower, olive, or canola oils. Natural peanut or almond butter. Other Unsalted popcorn and pretzels. The items listed above may not be a complete list of recommended foods or beverages. Contact your dietitian for more options. WHAT FOODS ARE NOT RECOMMENDED? Grains White bread. White pasta. White rice. Refined cornbread. Bagels and croissants. Crackers that contain trans fat. Vegetables Creamed or fried vegetables. Vegetables in a cheese sauce. Regular canned vegetables. Regular canned tomato sauce and paste. Regular tomato and vegetable juices. Fruits Dried fruits. Canned fruit in light or heavy syrup. Fruit juice. Meat and Other Protein Products Fatty cuts of meat. Ribs, chicken wings, bacon, sausage, bologna, salami, chitterlings, fatback, hot dogs, bratwurst, and packaged luncheon meats. Salted nuts and seeds. Canned beans with salt. Dairy Whole or 2% milk, cream, half-and-half, and cream cheese. Whole-fat or sweetened yogurt. Full-fat   cheeses or blue cheese. Nondairy creamers and whipped toppings. Processed cheese, cheese spreads, or cheese  curds. Condiments Onion and garlic salt, seasoned salt, table salt, and sea salt. Canned and packaged gravies. Worcestershire sauce. Tartar sauce. Barbecue sauce. Teriyaki sauce. Soy sauce, including reduced sodium. Steak sauce. Fish sauce. Oyster sauce. Cocktail sauce. Horseradish. Ketchup and mustard. Meat flavorings and tenderizers. Bouillon cubes. Hot sauce. Tabasco sauce. Marinades. Taco seasonings. Relishes. Fats and Oils Butter, stick margarine, lard, shortening, ghee, and bacon fat. Coconut, palm kernel, or palm oils. Regular salad dressings. Other Pickles and olives. Salted popcorn and pretzels. The items listed above may not be a complete list of foods and beverages to avoid. Contact your dietitian for more information. WHERE CAN I FIND MORE INFORMATION? National Heart, Lung, and Blood Institute: www.nhlbi.nih.gov/health/health-topics/topics/dash/ Document Released: 02/20/2011 Document Revised: 07/18/2013 Document Reviewed: 01/05/2013 ExitCare Patient Information 2015 [employer]. This information is not intended to replace advice given to you by your health care provider. Make sure you discuss any questions you have with your health care provider. Diabetes Mellitus and Food It is important for you to manage your blood sugar (glucose) level. Your blood glucose level can be greatly affected by what you eat. Eating healthier foods in the appropriate amounts throughout the day at about the same time each day will help you control your blood glucose level. It can also help slow or prevent worsening of your diabetes mellitus. Healthy eating may even help you improve the level of your blood pressure and reach or maintain a healthy weight.  HOW CAN FOOD AFFECT ME? Carbohydrates Carbohydrates affect your blood glucose level more than any other type of food. Your dietitian will help you determine how many carbohydrates to eat at each meal and teach you how to count carbohydrates. Counting  carbohydrates is important to keep your blood glucose at a healthy level, especially if you are using insulin or taking certain medicines for diabetes mellitus. Alcohol Alcohol can cause sudden decreases in blood glucose (hypoglycemia), especially if you use insulin or take certain medicines for diabetes mellitus. Hypoglycemia can be a life-threatening condition. Symptoms of hypoglycemia (sleepiness, dizziness, and disorientation) are similar to symptoms of having too much alcohol.  If your health care provider has given you approval to drink alcohol, do so in moderation and use the following guidelines:  Women should not have more than one drink per day, and men should not have more than two drinks per day. One drink is equal to:  12 oz of beer.  5 oz of wine.  1 oz of hard liquor.  Do not drink on an empty stomach.  Keep yourself hydrated. Have water, diet soda, or unsweetened iced tea.  Regular soda, juice, and other mixers might contain a lot of carbohydrates and should be counted. WHAT FOODS ARE NOT RECOMMENDED? As you make food choices, it is important to remember that all foods are not the same. Some foods have fewer nutrients per serving than other foods, even though they might have the same number of calories or carbohydrates. It is difficult to get your body what it needs when you eat foods with fewer nutrients. Examples of foods that you should avoid that are high in calories and carbohydrates but low in nutrients include:  Trans fats (most processed foods list trans fats on the Nutrition Facts label).  Regular soda.  Juice.  Candy.  Sweets, such as cake, pie, doughnuts, and cookies.  Fried foods. WHAT FOODS CAN I EAT? Have nutrient-rich foods,   which will nourish your body and keep you healthy. The food you should eat also will depend on several factors, including:  The calories you need.  The medicines you take.  Your weight.  Your blood glucose level.  Your  blood pressure level.  Your cholesterol level. You also should eat a variety of foods, including:  Protein, such as meat, poultry, fish, tofu, nuts, and seeds (lean animal proteins are best).  Fruits.  Vegetables.  Dairy products, such as milk, cheese, and yogurt (low fat is best).  Breads, grains, pasta, cereal, rice, and beans.  Fats such as olive oil, trans fat-free margarine, canola oil, avocado, and olives. DOES EVERYONE WITH DIABETES MELLITUS HAVE THE SAME MEAL PLAN? Because every person with diabetes mellitus is different, there is not one meal plan that works for everyone. It is very important that you meet with a dietitian who will help you create a meal plan that is just right for you. Document Released: 11/28/2004 Document Revised: 03/08/2013 Document Reviewed: 01/28/2013 ExitCare Patient Information 2015 [employer]. This information is not intended to replace advice given to you by your health care provider. Make sure you discuss any questions you have with your health care provider.  

## 2014-06-30 NOTE — Progress Notes (Signed)
Patient here for annual exam. No current health concerns or complaints. Patient needing refills of hyzaar and toprol-xl. Patient indicates she is walking daily (30 minutes/day) for exercise and eating a healthy diet to help control blood pressure.

## 2014-06-30 NOTE — Progress Notes (Signed)
Patient Demographics  Paula Alexander, is a 59 y.o. female  ZOX:[id number]  WJX:[id number]  DOB - January 07, 1956  CC:  Chief Complaint  Patient presents with  . Annual Exam       HPI: Paula Alexander is a 59 y.o. female here today for annual physical examination..patient has history of hypertension, hyperlipidemia, vitamin D deficiency, impaired fasting glucose, today she denies any acute symptoms, requesting refill on her medications.patient has modified her diet and does regular exercise to lose weight. Patient has No headache, No chest pain, No abdominal pain - No Nausea, No new weakness tingling or numbness, No Cough - SOB.  No Known Allergies Past Medical History  Diagnosis Date  . Hypertension   . Calcium deficiency   . Fibromyalgia   . Heart murmur   . Hyperlipidemia    Current Outpatient Prescriptions on File Prior to Visit  Medication Sig Dispense Refill  . aspirin EC 81 MG tablet Take 81 mg by mouth daily.    . Vitamin D, Ergocalciferol, (DRISDOL) 50000 UNITS CAPS capsule Take 1 capsule (50,000 Units total) by mouth every 7 (seven) days. (Patient not taking: Reported on 06/30/2014) 12 capsule 0  . [DISCONTINUED] losartan (COZAAR) 100 MG tablet Take 1 tablet (100 mg total) by mouth daily. 30 tablet 2  . [DISCONTINUED] metoprolol (LOPRESSOR) 50 MG tablet Take 1 tablet (50 mg total) by mouth daily. 30 tablet 3   No current facility-administered medications on file prior to visit.   History reviewed. No pertinent family history. History   Social History  . Marital Status: Legally Separated    Spouse Name: N/A  . Number of Children: N/A  . Years of Education: N/A   Occupational History  . Not on file.   Social History Main Topics  . Smoking status: Never Smoker   . Smokeless tobacco: Not on file  . Alcohol Use: No  . Drug Use: No  . Sexual Activity: Not on file   Other Topics Concern  . Not on file   Social History Narrative    Review of  Systems: Constitutional: Negative for fever, chills, diaphoresis, activity change, appetite change and fatigue. HENT: Negative for ear pain, nosebleeds, congestion, facial swelling, rhinorrhea, neck pain, neck stiffness and ear discharge.  Eyes: Negative for pain, discharge, redness, itching and visual disturbance. Respiratory: Negative for cough, choking, chest tightness, shortness of breath, wheezing and stridor.  Cardiovascular: Negative for chest pain, palpitations and leg swelling. Gastrointestinal: Negative for abdominal distention. Genitourinary: Negative for dysuria, urgency, frequency, hematuria, flank pain, decreased urine volume, difficulty urinating and dyspareunia.  Musculoskeletal: Negative for back pain, joint swelling, arthralgia and gait problem. Neurological: Negative for dizziness, tremors, seizures, syncope, facial asymmetry, speech difficulty, weakness, light-headedness, numbness and headaches.  Hematological: Negative for adenopathy. Does not bruise/bleed easily. Psychiatric/Behavioral: Negative for hallucinations, behavioral problems, confusion, dysphoric mood, decreased concentration and agitation.    Objective:   Filed Vitals:   06/30/14 1141  BP: 128/83  Pulse: 79  Temp: 98.7 F (37.1 C)  Resp: 18    Physical Exam: Constitutional: Patient appears well-developed and well-nourished. No distress. HENT: Normocephalic, atraumatic, External right and left ear normal. Oropharynx is clear and moist.  Eyes: Conjunctivae and EOM are normal. PERRLA, no scleral icterus. Neck: Normal ROM. Neck supple. No JVD. No tracheal deviation. No thyromegaly. CVS: RRR, S1/S2 +, no murmurs, no gallops, no carotid bruit.  Pulmonary: Effort and breath sounds normal, no stridor, rhonchi, wheezes, rales.  Abdominal: Soft. BS +, no distension,  tenderness, rebound or guarding.  Musculoskeletal: Normal range of motion. No edema and no tenderness.  Lymphadenopathy: No lymphadenopathy noted,  cervical, inguinal or axillary Neuro: Alert. Normal reflexes, muscle tone coordination. No cranial nerve deficit. Skin: Skin is warm and dry. No rash noted. Not diaphoretic. No erythema. No pallor. Psychiatric: Normal mood and affect. Behavior, judgment, thought content normal.  Lab Results  Component Value Date   WBC 6.0 05/24/2013   HGB 10.6* 05/24/2013   HCT 32.6* 05/24/2013   MCV 85.8 05/24/2013   PLT 227 05/24/2013   Lab Results  Component Value Date   CREATININE 0.89 05/24/2013   BUN 14 05/24/2013   NA 144 05/24/2013   K 4.2 05/24/2013   CL 106 05/24/2013   CO2 26 05/24/2013    Lab Results  Component Value Date/Time   HGBA1C 5.9 05/27/2013 11:14 AM   Lipid Panel     Component Value Date/Time   CHOL 178 05/24/2013 1643   TRIG 101 05/24/2013 1643   HDL 42 05/24/2013 1643   CHOLHDL 4.2 05/24/2013 1643   VLDL 20 05/24/2013 1643   LDLCALC 116* 05/24/2013 1643       Assessment and plan:   1. Annual physical exam  - TSH  2. Essential hypertension Advised patient for DASH diet, continue with current meds.  - metoprolol succinate (TOPROL-XL) 50 MG 24 hr tablet; TAKE 1 TABLET BY MOUTH DAILY. TAKE WITH OR IMMEDIATELY FOLLOWING A MEAL.  Dispense: 30 tablet; Refill: 5 - losartan-hydrochlorothiazide (HYZAAR) 100-25 MG per tablet; Take 1 tablet by mouth daily.  Dispense: 30 tablet; Refill: 5 - COMPLETE METABOLIC PANEL WITH GFR  3. Hyperlipidemia We'll check fasting lipid panel on next visit.  4. Encounter for screening mammogram for breast cancer  - MM DIGITAL SCREENING BILATERAL; Future  5. Special screening for malignant neoplasms, colon  - Ambulatory referral to Gastroenterology  6. IFG (impaired fasting glucose) Advised patient for low carbohydrate diet.  - TSH - Hemoglobin A1c - COMPLETE METABOLIC PANEL WITH GFR  7. History of anemia  - CBC with Differential/Platelet - Anemia panel  8. Vitamin D deficiency  - Vit D  25 hydroxy (rtn osteoporosis  monitoring)  Health Maintenance -Colonoscopy:referred to GI -Pap Smear:will be scheduled -Mammogram:ordered   Return in about 3 months (around 09/29/2014) for hypertension, Schedule Appt with Dr Glendora ScoreFunches/ [facility] for PAP.    The patient was given clear instructions to go to ER or return to medical center if symptoms don't improve, worsen or new problems develop. The patient verbalized understanding. The patient was told to call to get lab results if they haven't heard anything in the next week.    This note has been created with Education officer, environmentalDragon speech recognition software and smart phrase technology. Any transcriptional errors are unintentional.   Doris CheadleADVANI, Charlaine Utsey, MD

## 2014-07-01 LAB — VITAMIN D 25 HYDROXY (VIT D DEFICIENCY, FRACTURES): VIT D 25 HYDROXY: 14 ng/mL — AB (ref 30–100)

## 2014-07-03 MED ORDER — VITAMIN D (ERGOCALCIFEROL) 1.25 MG (50000 UNIT) PO CAPS: 50000.0000 [IU] | ORAL_CAPSULE | ORAL | Status: DC

## 2014-07-03 NOTE — Telephone Encounter (Signed)
-----   Message from Doris Cheadleeepak Advani, MD sent at 07/03/2014  9:50 AM EDT ----- Blood work reviewed noticed hemoglobin A1c of 6.3%, patient has prediabetes, call and advise patient for low carbohydrate diet. , noticed low vitamin D, call patient advise to start ergocalciferol 50,000 units once a week for the duration of  12 weeks, then take OTC vitamin d 2000 units daily. Also noted patient has borderline anemia, she has already been referred to GI for screening colonoscopy.

## 2014-07-03 NOTE — Telephone Encounter (Signed)
Patient not available Message left on voice mail to return our call 

## 2014-07-04 ENCOUNTER — Telehealth: Payer: Self-pay | Admitting: Internal Medicine

## 2014-07-04 NOTE — Telephone Encounter (Signed)
Patient is calling to speak to nurse, please f/u with pt. °

## 2014-07-07 ENCOUNTER — Telehealth: Payer: Self-pay

## 2014-07-07 ENCOUNTER — Telehealth: Payer: Self-pay | Admitting: General Practice

## 2014-07-07 ENCOUNTER — Other Ambulatory Visit: Payer: Self-pay | Admitting: Internal Medicine

## 2014-07-07 NOTE — Telephone Encounter (Signed)
Returned patient phone call and she is aware of her lab results and to pick Up her prescription for vitamin d

## 2014-07-07 NOTE — Telephone Encounter (Signed)
Patient calling to follow up on lab results °

## 2014-07-28 ENCOUNTER — Ambulatory Visit: Payer: Self-pay

## 2014-07-28 ENCOUNTER — Other Ambulatory Visit: Payer: Self-pay | Admitting: Internal Medicine

## 2014-10-02 ENCOUNTER — Ambulatory Visit (HOSPITAL_COMMUNITY)
Admission: RE | Admit: 2014-10-02 | Discharge: 2014-10-02 | Disposition: A | Discharge status: Home / Self Care | Payer: Self-pay | Source: Ambulatory Visit | Attending: Internal Medicine | Admitting: Internal Medicine

## 2014-10-02 DIAGNOSIS — Z1231 Encounter for screening mammogram for malignant neoplasm of breast: Secondary | ICD-10-CM

## 2014-10-03 ENCOUNTER — Ambulatory Visit (HOSPITAL_COMMUNITY): Payer: Self-pay

## 2014-10-12 ENCOUNTER — Encounter: Payer: Self-pay | Admitting: *Deleted

## 2014-11-08 ENCOUNTER — Encounter (HOSPITAL_COMMUNITY): Payer: Self-pay | Admitting: *Deleted

## 2014-11-08 ENCOUNTER — Emergency Department (HOSPITAL_COMMUNITY)
Admission: EM | Admit: 2014-11-08 | Discharge: 2014-11-08 | Disposition: A | Discharge status: Home / Self Care | Payer: Self-pay | Attending: Emergency Medicine | Admitting: Emergency Medicine

## 2014-11-08 ENCOUNTER — Emergency Department (HOSPITAL_COMMUNITY): Payer: Self-pay

## 2014-11-08 DIAGNOSIS — R011 Cardiac murmur, unspecified: Secondary | ICD-10-CM | POA: Insufficient documentation

## 2014-11-08 DIAGNOSIS — I10 Essential (primary) hypertension: Secondary | ICD-10-CM | POA: Insufficient documentation

## 2014-11-08 DIAGNOSIS — Z7982 Long term (current) use of aspirin: Secondary | ICD-10-CM | POA: Insufficient documentation

## 2014-11-08 DIAGNOSIS — Z8781 Personal history of (healed) traumatic fracture: Secondary | ICD-10-CM | POA: Insufficient documentation

## 2014-11-08 DIAGNOSIS — Y9389 Activity, other specified: Secondary | ICD-10-CM | POA: Insufficient documentation

## 2014-11-08 DIAGNOSIS — M7072 Other bursitis of hip, left hip: Secondary | ICD-10-CM | POA: Insufficient documentation

## 2014-11-08 DIAGNOSIS — M797 Fibromyalgia: Secondary | ICD-10-CM | POA: Insufficient documentation

## 2014-11-08 DIAGNOSIS — Z79899 Other long term (current) drug therapy: Secondary | ICD-10-CM | POA: Insufficient documentation

## 2014-11-08 MED ORDER — NAPROXEN 500 MG PO TABS: 500.0000 mg | ORAL_TABLET | Freq: Two times a day (BID) | ORAL | Status: DC

## 2014-11-08 MED ORDER — NAPROXEN 250 MG PO TABS
500.0000 mg | ORAL_TABLET | Freq: Once | ORAL | Status: AC
  Administered 2014-11-08: 500 mg via ORAL
  Filled 2014-11-08: qty 2

## 2014-11-08 NOTE — Discharge Instructions (Signed)
Bursitis °Bursitis is a swelling and soreness (inflammation) of a fluid-filled sac (bursa) that overlies and protects a joint. It can be caused by injury, overuse of the joint, arthritis or infection. The joints most likely to be affected are the elbows, shoulders, hips and knees. °HOME CARE INSTRUCTIONS  °· Apply ice to the affected area for 15-20 minutes each hour while awake for 2 days. Put the ice in a plastic bag and place a towel between the bag of ice and your skin. °· Rest the injured joint as much as possible, but continue to put the joint through a full range of motion, 4 times per day. (The shoulder joint especially becomes rapidly "frozen" if not used.) When the pain lessens, begin normal slow movements and usual activities. °· Only take over-the-counter or prescription medicines for pain, discomfort or fever as directed by your caregiver. °· Your caregiver may recommend draining the bursa and injecting medicine into the bursa. This may help the healing process. °· Follow all instructions for follow-up with your caregiver. This includes any orthopedic referrals, physical therapy and rehabilitation. Any delay in obtaining necessary care could result in a delay or failure of the bursitis to heal and chronic pain. °SEEK IMMEDIATE MEDICAL CARE IF:  °· Your pain increases even during treatment. °· You develop an oral temperature above 102° F (38.9° C) and have heat and inflammation over the involved bursa. °MAKE SURE YOU:  °· Understand these instructions. °· Will watch your condition. °· Will get help right away if you are not doing well or get worse. °Document Released: 02/29/2000 Document Revised: 05/26/2011 Document Reviewed: 05/23/2013 °ExitCare® Patient Information ©2015 [employer]. This information is not intended to replace advice given to you by your health care provider. Make sure you discuss any questions you have with your health care provider. ° °

## 2014-11-08 NOTE — ED Provider Notes (Signed)
CSN: [id number]     Arrival date & time 11/08/14  1415 History  This chart was scribed for non-physician practitioner, Burgess Amor, PA-C, working with Cathren Laine, MD, by Budd Palmer ED Scribe. This patient was seen in room TR02C/TR02C and the patient's care was started at 3:01 PM     Chief Complaint  Patient presents with  . Hip Pain   The history is provided by the patient. No language interpreter was used.   HPI Comments: Paula Alexander is a 59 y.o. female who presents to the Emergency Department complaining of burning, constant hip pain onset 4 days ago. She denies any unusual activity or recent injury to the area. She has taken Tylenol 500 with relief for several hours. She has a PMHx of hip fracture sustained after a fall while roller skating, which occurred about 8 years ago. She was treated by an orthopedic surgeon in [place] for this. She was bedridden for 6 months after the injury but has been pain and symptom free until this week.  She denies any new injury.  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  Denies fevers, rash, swelling at the site.  Past Medical History  Diagnosis Date  . Hypertension   . Calcium deficiency   . Fibromyalgia   . Heart murmur   . Hyperlipidemia    History reviewed. No pertinent past surgical history. History reviewed. No pertinent family history. Social History  Substance Use Topics  . Smoking status: Never Smoker   . Smokeless tobacco: None  . Alcohol Use: No   OB History    No data available     Review of Systems  Constitutional: Negative for fever.  Musculoskeletal: Positive for arthralgias. Negative for myalgias.  Neurological: Negative for weakness and numbness.    Allergies  Review of patient's allergies indicates no known allergies.  Home Medications   Prior to Admission medications   Medication Sig Start Date End Date Taking? Authorizing Provider  aspirin EC 81 MG tablet Take 81 mg by mouth daily.    Historical  Provider, MD  losartan-hydrochlorothiazide (HYZAAR) 100-25 MG per tablet Take 1 tablet by mouth daily. 06/30/14   Doris Cheadle, MD  metoprolol succinate (TOPROL-XL) 50 MG 24 hr tablet TAKE 1 TABLET BY MOUTH DAILY. TAKE WITH OR IMMEDIATELY FOLLOWING A MEAL. 06/30/14   Doris Cheadle, MD  naproxen (NAPROSYN) 500 MG tablet Take 1 tablet (500 mg total) by mouth 2 (two) times daily. 11/08/14   Burgess Amor, PA-C  Vitamin D, Ergocalciferol, (DRISDOL) 50000 UNITS CAPS capsule Take 1 capsule (50,000 Units total) by mouth every 7 (seven) days. Patient not taking: Reported on 06/30/2014 05/27/13   Quentin Angst, MD  Vitamin D, Ergocalciferol, (DRISDOL) 50000 UNITS CAPS capsule Take 1 capsule (50,000 Units total) by mouth every 7 (seven) days. 07/03/14   Doris Cheadle, MD   BP 139/72 mmHg  Pulse 75  Temp(Src) 97.9 F (36.6 C) (Oral)  Resp 19  SpO2 99% Physical Exam  Constitutional: She appears well-developed and well-nourished.  HENT:  Head: Atraumatic.  Neck: Normal range of motion.  Cardiovascular:  Pulses equal bilaterally  Musculoskeletal: She exhibits tenderness.  TTP over the L hip greater trochanter. With palpation, pain radiates to the lateral knee. There is no pain with hip adduction and abduction. There is no rash, wounds, or bruising at the site. Knee itself is non-TTP. DP pulse is intact.  Neurological: She is alert. She has normal strength. She displays normal reflexes. No sensory deficit.  Skin: Skin is warm and dry.  Psychiatric: She has a normal mood and affect.    ED Course  Procedures  DIAGNOSTIC STUDIES: Oxygen Saturation is 97% on RA, adequate by my interpretation.    COORDINATION OF CARE: 3:09 PM - Discussed plans to order diagnostic imaging and naproxen. If the XR is normal, will treat as bursitis. Advised to apply a heating pad to the area and take anti-inflammatory medication. Pt advised of plan for treatment and pt agrees.  Labs Review Labs Reviewed - No data to  display  Imaging Review Dg Hip Unilat With Pelvis 2-3 Views Left  11/08/2014   CLINICAL DATA:  Constant burning hip pain beginning 4 days ago. No known injury. Initial encounter.  EXAM: DG HIP (WITH OR WITHOUT PELVIS) 2-3V LEFT  COMPARISON:  None.  FINDINGS: There is no evidence of hip fracture or dislocation. There is no evidence of arthropathy or other focal bone abnormality.  IMPRESSION: Normal examination.   Electronically Signed   By: Drusilla Kanner M.D.   On: 11/08/2014 16:51   I have personally reviewed and evaluated these images and lab results as part of my medical decision-making.   EKG Interpretation None      MDM   Final diagnoses:  Hip bursitis, left    Naproxen prescribed,  Also may continue taking tylenol since this did improve sx.  She was advised f/u with her pcp for a recheck in one week if sx persist, may also apply ice tx.  I personally performed the services described in this documentation, which was scribed in my presence. The recorded information has been reviewed and is accurate.   Burgess Amor, PA-C 11/09/14 1358  Cathren Laine, MD 11/09/14 [phone]

## 2014-11-08 NOTE — ED Notes (Signed)
Pt reports hx of left hip fracture. Began having return of pain to left hip on Sunday. Denies new injury.

## 2015-02-01 ENCOUNTER — Other Ambulatory Visit: Payer: Self-pay | Admitting: Internal Medicine

## 2015-02-01 NOTE — Telephone Encounter (Signed)
Nurse called home number, reached recording explaining person is not available at this time, call back later. Nurse called mobile number, no answer. Nurse called patient to make patient aware of need for appointment. Nurse will refill metoprolol for 1 month, patient needs appointment for additional refills.

## 2015-02-01 NOTE — Telephone Encounter (Signed)
Nurse called patient at home number, reached recording explaining the person you have called is not available right now. Nurse called mobile number, no answer, no voice mail.  Nurse called patient to make patient aware of 1 month supply of metoprolol and losartan-hctz sent to pharmacy.

## 2015-03-02 ENCOUNTER — Other Ambulatory Visit: Payer: Self-pay | Admitting: Family Medicine

## 2015-03-05 MED ORDER — LOSARTAN POTASSIUM-HCTZ 100-25 MG PO TABS: 1.0000 | ORAL_TABLET | Freq: Every day | ORAL | Status: DC

## 2015-03-05 NOTE — Telephone Encounter (Signed)
losartan-hydrochlorothiazide (HYZAAR) 100-25 MG tablet metoprolol succinate (TOPROL-XL) 50 MG 24 hr tablet

## 2015-03-05 NOTE — Telephone Encounter (Signed)
Nurse called patient, patient verified date of birth. Patient called in prescription on December 15, last Thursday. Prompt said she could pick up medication after 12 on December 16. Patient needs office visit prior to additional refills.  Nurse sent prescription to pharmacy. Per front desk staff, patient needs to call back January 3 to schedule.  [facility]. Patient understands she will not get any more refills until appointment is made.

## 2015-04-05 MED FILL — METOPROLOL SUCC ER 50 MG TA: 50 | 30 days supply | Qty: 30 | Fill #1

## 2015-04-05 MED FILL — LOSARTAN-HCTZ 100-25 MG TAB: 100-25 | 30 days supply | Qty: 30 | Fill #1

## 2015-04-06 ENCOUNTER — Telehealth: Payer: Self-pay | Admitting: Family Medicine

## 2015-04-06 ENCOUNTER — Encounter: Payer: Self-pay | Admitting: Family Medicine

## 2015-04-06 ENCOUNTER — Ambulatory Visit: Payer: Self-pay | Attending: Family Medicine | Admitting: Family Medicine

## 2015-04-06 VITALS — BP 125/70 | HR 70 | Temp 98.2°F | Resp 16 | Ht 62.5 in | Wt 195.0 lb

## 2015-04-06 DIAGNOSIS — I10 Essential (primary) hypertension: Secondary | ICD-10-CM

## 2015-04-06 DIAGNOSIS — E785 Hyperlipidemia, unspecified: Secondary | ICD-10-CM

## 2015-04-06 DIAGNOSIS — R7303 Prediabetes: Secondary | ICD-10-CM

## 2015-04-06 DIAGNOSIS — E559 Vitamin D deficiency, unspecified: Secondary | ICD-10-CM

## 2015-04-06 DIAGNOSIS — N760 Acute vaginitis: Secondary | ICD-10-CM

## 2015-04-06 DIAGNOSIS — Z0000 Encounter for general adult medical examination without abnormal findings: Secondary | ICD-10-CM

## 2015-04-06 DIAGNOSIS — N6489 Other specified disorders of breast: Secondary | ICD-10-CM | POA: Insufficient documentation

## 2015-04-06 DIAGNOSIS — Z114 Encounter for screening for human immunodeficiency virus [HIV]: Secondary | ICD-10-CM

## 2015-04-06 DIAGNOSIS — Z1159 Encounter for screening for other viral diseases: Secondary | ICD-10-CM

## 2015-04-06 DIAGNOSIS — Z01419 Encounter for gynecological examination (general) (routine) without abnormal findings: Secondary | ICD-10-CM | POA: Insufficient documentation

## 2015-04-06 DIAGNOSIS — Z7982 Long term (current) use of aspirin: Secondary | ICD-10-CM | POA: Insufficient documentation

## 2015-04-06 DIAGNOSIS — M797 Fibromyalgia: Secondary | ICD-10-CM | POA: Insufficient documentation

## 2015-04-06 DIAGNOSIS — A499 Bacterial infection, unspecified: Secondary | ICD-10-CM

## 2015-04-06 DIAGNOSIS — B9689 Other specified bacterial agents as the cause of diseases classified elsewhere: Secondary | ICD-10-CM

## 2015-04-06 DIAGNOSIS — N62 Hypertrophy of breast: Secondary | ICD-10-CM

## 2015-04-06 DIAGNOSIS — Z23 Encounter for immunization: Secondary | ICD-10-CM | POA: Insufficient documentation

## 2015-04-06 LAB — HEMOCCULT GUIAC POC 1CARD (OFFICE): Fecal Occult Blood, POC: NEGATIVE

## 2015-04-06 LAB — POCT URINALYSIS DIPSTICK
Blood, UA: NEGATIVE
Glucose, UA: NEGATIVE
KETONES UA: NEGATIVE
Leukocytes, UA: NEGATIVE
Nitrite, UA: NEGATIVE
PH UA: 6
PROTEIN UA: NEGATIVE
Spec Grav, UA: 1.025
Urobilinogen, UA: 0.2

## 2015-04-06 LAB — CBC
HCT: 36 % (ref 36.0–46.0)
HEMOGLOBIN: 11.8 g/dL — AB (ref 12.0–15.0)
MCH: 28.6 pg (ref 26.0–34.0)
MCHC: 32.8 g/dL (ref 30.0–36.0)
MCV: 87.4 fL (ref 78.0–100.0)
MPV: 11.9 fL (ref 8.6–12.4)
Platelets: 237 10*3/uL (ref 150–400)
RBC: 4.12 MIL/uL (ref 3.87–5.11)
RDW: 14.2 % (ref 11.5–15.5)
WBC: 6.7 10*3/uL (ref 4.0–10.5)

## 2015-04-06 LAB — LIPID PANEL
Cholesterol: 200 mg/dL (ref 125–200)
HDL: 60 mg/dL (ref >=46)
LDL CALC: 108 mg/dL (ref <130)
Total CHOL/HDL Ratio: 3.3 Ratio (ref <=5.0)
Triglycerides: 161 mg/dL — ABNORMAL HIGH (ref <150)
VLDL: 32 mg/dL — ABNORMAL HIGH (ref <30)

## 2015-04-06 LAB — COMPLETE METABOLIC PANEL WITH GFR
ALT: 17 U/L (ref 6–29)
AST: 24 U/L (ref 10–35)
Albumin: 3.8 g/dL (ref 3.6–5.1)
Alkaline Phosphatase: 75 U/L (ref 33–130)
BUN: 18 mg/dL (ref 7–25)
CHLORIDE: 104 mmol/L (ref 98–110)
CO2: 27 mmol/L (ref 20–31)
Calcium: 9.5 mg/dL (ref 8.6–10.4)
Creat: 1.03 mg/dL (ref 0.50–1.05)
GFR, Est African American: 69 mL/min (ref >=60)
GFR, Est Non African American: 60 mL/min (ref >=60)
GLUCOSE: 94 mg/dL (ref 65–99)
POTASSIUM: 4.3 mmol/L (ref 3.5–5.3)
Sodium: 142 mmol/L (ref 135–146)
Total Bilirubin: 0.3 mg/dL (ref 0.2–1.2)
Total Protein: 6.8 g/dL (ref 6.1–8.1)

## 2015-04-06 MED ORDER — LOSARTAN POTASSIUM-HCTZ 100-25 MG PO TABS: 1.0000 | ORAL_TABLET | Freq: Every day | ORAL | Status: DC

## 2015-04-06 MED ORDER — METOPROLOL SUCCINATE ER 50 MG PO TB24: 50.0000 mg | ORAL_TABLET | Freq: Every day | ORAL | Status: DC

## 2015-04-06 NOTE — Assessment & Plan Note (Signed)
A: extremely large and pendulous breast. She is at risk for chronic upper back pain with time.  P:  Exercise with focus on upper back and chest strengthening Plastic surgery referral to determine if there are any charitable resources available

## 2015-04-06 NOTE — Progress Notes (Signed)
Patient ID: MADELEINE FENN, female   DOB: 12-17-1955, 60 y.o.   MRN: [id number]   Subjective:  Patient ID: [name], female    DOB: 11/28/1955  Age: 60 y.o. MRN: [id number]  CC: Annual Exam   HPI KAYDYN CHISM presents for wellness. She has no acute complaints.  She has large pendulous breast that are getting bigger. She would like breast reduction but is uninsured. She denies significant back or chest wall pain. Her breast size is N. She has history of fibromyalgia but denies current arthralgia and myalgia.   Social History  Substance Use Topics  . Smoking status: Never Smoker   . Smokeless tobacco: Not on file  . Alcohol Use: No    Outpatient Prescriptions Prior to Visit  Medication Sig Dispense Refill  . aspirin EC 81 MG tablet Take 81 mg by mouth daily.    Marland Kitchen losartan-hydrochlorothiazide (HYZAAR) 100-25 MG tablet Take 1 tablet by mouth daily. 30 tablet 1  . metoprolol succinate (TOPROL-XL) 50 MG 24 hr tablet TAKE 1 TABLET BY MOUTH DAILY. TAKE WITH OR IMMEDIATELY FOLLOWING A MEAL. 30 tablet 1  . naproxen (NAPROSYN) 500 MG tablet Take 1 tablet (500 mg total) by mouth 2 (two) times daily. (Patient not taking: Reported on 04/06/2015) 30 tablet 0  . Vitamin D, Ergocalciferol, (DRISDOL) 50000 UNITS CAPS capsule Take 1 capsule (50,000 Units total) by mouth every 7 (seven) days. (Patient not taking: Reported on 06/30/2014) 12 capsule 0  . Vitamin D, Ergocalciferol, (DRISDOL) 50000 UNITS CAPS capsule Take 1 capsule (50,000 Units total) by mouth every 7 (seven) days. 12 capsule 0   No facility-administered medications prior to visit.    ROS Review of Systems  Constitutional: Negative for fever and chills.  Eyes: Negative for visual disturbance.  Respiratory: Negative for shortness of breath.   Cardiovascular: Negative for chest pain.  Gastrointestinal: Negative for abdominal pain and blood in stool.  Musculoskeletal: Negative for back pain and arthralgias.  Skin:  Negative for rash.  Allergic/Immunologic: Negative for immunocompromised state.  Hematological: Negative for adenopathy. Does not bruise/bleed easily.  Psychiatric/Behavioral: Negative for suicidal ideas and dysphoric mood.    Objective:  BP 125/70 mmHg  Pulse 70  Temp(Src) 98.2 F (36.8 C) (Oral)  Resp 16  Ht 5' 2.5" (1.588 m)  Wt 195 lb (88.451 kg)  BMI 35.08 kg/m2  SpO2 98%  BP/Weight 04/06/2015 11/08/2014 06/30/2014  Systolic BP 125 139 128  Diastolic BP 70 72 83  Wt. (Lbs) 195 - 187.8  BMI 35.08 - 33.78    Physical Exam  Constitutional: She appears well-developed and well-nourished. No distress.  Cardiovascular: Normal rate, regular rhythm, normal heart sounds and intact distal pulses.   Pulmonary/Chest: Effort normal and breath sounds normal. Right breast exhibits no inverted nipple, no mass, no nipple discharge, no skin change and no tenderness. Left breast exhibits no inverted nipple, no mass, no nipple discharge, no skin change and no tenderness. Breasts are symmetrical.  Both breast are large and pendulous   Genitourinary: Rectum normal, vagina normal and uterus normal. Rectal exam shows no external hemorrhoid, no internal hemorrhoid, no fissure, no mass, no tenderness and anal tone normal. Guaiac negative stool. Pelvic exam was performed with patient prone. There is no rash, tenderness or lesion on the right labia. There is no rash, tenderness or lesion on the left labia. Cervix exhibits no motion tenderness, no discharge and no friability.  Musculoskeletal: She exhibits no edema.  Lymphadenopathy:  Right: No inguinal adenopathy present.       Left: No inguinal adenopathy present.  Skin: Skin is warm and dry. No rash noted.    Lab Results  Component Value Date   HGBA1C 6.3* 06/30/2014    Assessment & Plan:   Problem List Items Addressed This Visit    Hypovitaminosis D   Relevant Orders   Vitamin D, 25-hydroxy   Hypertension (Chronic)   Relevant  Medications   metoprolol succinate (TOPROL-XL) 50 MG 24 hr tablet   losartan-hydrochlorothiazide (HYZAAR) 100-25 MG tablet   Other Relevant Orders   CBC   COMPLETE METABOLIC PANEL WITH GFR   Hyperlipidemia (Chronic)   Relevant Medications   metoprolol succinate (TOPROL-XL) 50 MG 24 hr tablet   losartan-hydrochlorothiazide (HYZAAR) 100-25 MG tablet   Other Relevant Orders   Lipid Panel    Other Visit Diagnoses    Healthcare maintenance    -  Primary    Relevant Orders    POCT urinalysis dipstick (Completed)    Flu Vaccine QUAD 36+ mos IM    Cytology - PAP [place]    Ambulatory referral to Gastroenterology    HgB A1c    Hemoccult - 1 Card (office) (Completed)    Screening for HIV (human immunodeficiency virus)        Relevant Orders    HIV antibody (with reflex)    Need for hepatitis C screening test        Relevant Orders    Hepatitis C antibody, reflex       No orders of the defined types were placed in this encounter.    Follow-up: No Follow-up on file.   Dessa Phi MD

## 2015-04-06 NOTE — Progress Notes (Signed)
Annual physical and pap smear Last pap smear with normal results  No Pain today  No tobacco user  No suicidal thought in the past two weeks

## 2015-04-06 NOTE — Patient Instructions (Addendum)
Diagnoses and all orders for this visit:  Healthcare maintenance -     POCT urinalysis dipstick -     Flu Vaccine QUAD 36+ mos IM -     Cytology - PAP [place] -     Ambulatory referral to Gastroenterology -     HgB A1c  Screening for HIV (human immunodeficiency virus) -     HIV antibody (with reflex)  Need for hepatitis C screening test -     Hepatitis C antibody, reflex  Hypovitaminosis D -     Vitamin D, 25-hydroxy  Essential hypertension -     CBC -     COMPLETE METABOLIC PANEL WITH GFR -     metoprolol succinate (TOPROL-XL) 50 MG 24 hr tablet; Take 1 tablet (50 mg total) by mouth daily. Take with or immediately following a meal. -     losartan-hydrochlorothiazide (HYZAAR) 100-25 MG tablet; Take 1 tablet by mouth daily.  Hyperlipidemia -     Lipid Panel    You will be call with lab and pap results You will also be called with GI and dental appointment details  You will also be called if there are any resources for breast reduction  F/u in one year, sooner if needed  Dr. Armen Pickup

## 2015-04-07 LAB — VITAMIN D 25 HYDROXY (VIT D DEFICIENCY, FRACTURES): Vit D, 25-Hydroxy: 14 ng/mL — ABNORMAL LOW (ref 30–100)

## 2015-04-07 LAB — HIV ANTIBODY (ROUTINE TESTING W REFLEX): HIV 1&2 Ab, 4th Generation: NONREACTIVE

## 2015-04-07 LAB — HEPATITIS C ANTIBODY: HCV Ab: NEGATIVE

## 2015-04-08 DIAGNOSIS — R7303 Prediabetes: Secondary | ICD-10-CM | POA: Insufficient documentation

## 2015-04-08 MED ORDER — VITAMIN D (ERGOCALCIFEROL) 1.25 MG (50000 UNIT) PO CAPS: 50000.0000 [IU] | ORAL_CAPSULE | ORAL | Status: DC

## 2015-04-08 NOTE — Assessment & Plan Note (Signed)
A1c elevated at level of pre-diabetes. Triglycerides also elevated. Low carb and low sugar diet recommended. Avoid sugar sweetened drinks like juice and soda. Increase vegetables. Limits starch like bread, pasta, potato. Limit sweets. Recheck A1c in 6 months recommended.

## 2015-04-08 NOTE — Telephone Encounter (Signed)
Please inform patient that per Arna Medici, "Unfortunately we don't have resources for Plastic Surgery is considered Cosmetic".

## 2015-04-08 NOTE — Addendum Note (Signed)
Addended by: Dessa Phi on: 04/08/2015 08:47 PM   Modules accepted: Orders, SmartSet

## 2015-04-09 LAB — CERVICOVAGINAL ANCILLARY ONLY
Chlamydia: NEGATIVE
Neisseria Gonorrhea: NEGATIVE
TRICH (WINDOWPATH): NEGATIVE
WET PREP (BD AFFIRM): POSITIVE — AB

## 2015-04-09 LAB — CYTOLOGY - PAP

## 2015-04-09 MED ORDER — METRONIDAZOLE 500 MG PO TABS: 500.0000 mg | ORAL_TABLET | Freq: Two times a day (BID) | ORAL | Status: AC

## 2015-04-09 MED ORDER — FLUCONAZOLE 150 MG PO TABS: 150.0000 mg | ORAL_TABLET | Freq: Once | ORAL | Status: DC

## 2015-04-09 NOTE — Addendum Note (Signed)
Addended by: Dessa Phi on: 04/09/2015 08:35 AM   Modules accepted: Orders, SmartSet

## 2015-04-11 NOTE — Telephone Encounter (Signed)
Unable to contact Pt  "Voice mail not set up yet" 

## 2015-04-17 NOTE — Telephone Encounter (Signed)
-----   Message from Dessa Phi, MD sent at 04/09/2015  1:27 PM EST ----- Screening Gc.chlam. Trich negative

## 2015-04-17 NOTE — Telephone Encounter (Signed)
Date of birth verified by pt  Normal Lab results given  Surgery not cover by insurance  BV positive, Vit D low, Rx at [facility] pharmacy  Pt verbalized understanding  Next pap smear in 3-5 years

## 2015-04-17 NOTE — Telephone Encounter (Signed)
-----   Message from Dessa Phi, MD sent at 04/09/2015  8:33 AM EST ----- BV positive on wet prep, sent in flagyl

## 2015-04-17 NOTE — Telephone Encounter (Signed)
-----   Message from Dessa Phi, MD sent at 04/12/2015  9:09 AM EST ----- Negative pap, Repeat in 3-5 years Last pap at age 60  Patient's mychart pending  Encourage patient to sign up for mychart to receive results

## 2015-04-17 NOTE — Telephone Encounter (Signed)
-----   Message from Dessa Phi, MD sent at 04/08/2015  8:45 PM EST ----- Vit D def will replace A1c elevated at level of pre-diabetes. Triglycerides also elevated. Low carb and low sugar diet recommended. Avoid sugar sweetened drinks like juice and soda. Increase vegetables. Limits starch like bread, pasta, potato. Limit sweets. Recheck A1c in 6 months recommended.  CBC with slight anemia, improved from 9 months ago  Elevated  Screening HIV and hep C negative CMP normal

## 2015-04-19 MED FILL — FLUCONAZOLE 150 MG TABLET: 150 | 1 days supply | Qty: 1 | Fill #0

## 2015-04-19 MED FILL — metroNIDAZOLE 500 MG TABS: 500 | 7 days supply | Qty: 14 | Fill #0

## 2015-04-20 MED FILL — VIT D2 1.25 MG (50,000 UNIT: 1.25 MG | 28 days supply | Qty: 4 | Fill #0

## 2015-05-07 MED FILL — METOPROLOL SUCC ER 50 MG TA: 50 | 30 days supply | Qty: 30 | Fill #2

## 2015-05-07 MED FILL — LOSARTAN-HCTZ 100-25 MG TAB: 100-25 | 30 days supply | Qty: 30 | Fill #0

## 2015-06-07 MED FILL — LOSARTAN-HCTZ 100-25 MG TAB: 100-25 | 30 days supply | Qty: 30 | Fill #1

## 2015-06-07 MED FILL — METOPROLOL SUCC ER 50 MG TA: 50 | 30 days supply | Qty: 30 | Fill #0

## 2015-07-11 MED FILL — METOPROLOL SUCC ER 50 MG TA: 50 | 30 days supply | Qty: 30 | Fill #1

## 2015-07-11 MED FILL — LOSARTAN-HCTZ 100-25 MG TAB: 100-25 | 30 days supply | Qty: 30 | Fill #2

## 2015-08-16 MED FILL — METOPROLOL SUCC ER 50 MG TA: 50 | 30 days supply | Qty: 30 | Fill #2

## 2015-08-16 MED FILL — LOSARTAN-HCTZ 100-25 MG TAB: 100-25 | 30 days supply | Qty: 30 | Fill #3

## 2015-09-17 MED FILL — METOPROLOL SUCC ER 50 MG TA: 50 | 30 days supply | Qty: 30 | Fill #3

## 2015-09-17 MED FILL — ?LOSARTAN-HCTZ 100-25 MG TA: 100MG-25MG | 30 days supply | Qty: 30 | Fill #4

## 2015-10-24 MED FILL — METOPROLOL SUCC ER 50 MG TA: 50 | 30 days supply | Qty: 30 | Fill #4

## 2015-10-24 MED FILL — ?LOSARTAN-HCTZ 100-25 MG TA: 100MG-25MG | 30 days supply | Qty: 30 | Fill #5

## 2015-11-26 MED FILL — ?LOSARTAN-HCTZ 100-25 MG TA: 100MG-25MG | 30 days supply | Qty: 30 | Fill #6

## 2015-11-26 MED FILL — METOPROLOL SUCC ER 50 MG TA: 50 | 30 days supply | Qty: 30 | Fill #5

## 2015-12-27 MED FILL — LOSARTAN-HCTZ 100-25 MG TAB: 100-25 | 30 days supply | Qty: 30 | Fill #7

## 2015-12-27 MED FILL — METOPROLOL SUCC ER 50 MG TA: 50 | 30 days supply | Qty: 30 | Fill #6

## 2016-01-22 MED FILL — LOSARTAN-HCTZ 100-25 MG TAB: 100-25 | 30 days supply | Qty: 30 | Fill #8

## 2016-01-22 MED FILL — METOPROLOL SUCC ER 50 MG TA: 50 | 30 days supply | Qty: 30 | Fill #7

## 2016-02-28 MED FILL — LOSARTAN-HCTZ 100-25 MG TAB: 100-25 | 30 days supply | Qty: 30 | Fill #9

## 2016-02-28 MED FILL — METOPROLOL SUCC ER 50 MG TA: 50 | 30 days supply | Qty: 30 | Fill #8

## 2016-04-03 MED FILL — METOPROLOL SUCC ER 50 MG TA: 50 | 30 days supply | Qty: 30 | Fill #9

## 2016-04-03 MED FILL — LOSARTAN-HCTZ 100-25 MG TAB: 100-25 | 30 days supply | Qty: 30 | Fill #10

## 2016-05-13 ENCOUNTER — Telehealth: Payer: Self-pay | Admitting: Family Medicine

## 2016-05-13 DIAGNOSIS — I10 Essential (primary) hypertension: Secondary | ICD-10-CM

## 2016-05-13 MED ORDER — LOSARTAN POTASSIUM-HCTZ 100-25 MG PO TABS: 1.0000 | ORAL_TABLET | Freq: Every day | ORAL | 0 refills | Status: DC

## 2016-05-13 MED ORDER — METOPROLOL SUCCINATE ER 50 MG PO TB24: 50.0000 mg | ORAL_TABLET | Freq: Every day | ORAL | 0 refills | Status: DC

## 2016-05-13 MED FILL — LOSARTAN-HCTZ 100-25 MG TAB: 100-25 | 30 days supply | Qty: 30 | Fill #0

## 2016-05-13 MED FILL — METOPROLOL SUCC ER 50 MG TA: 50 | 30 days supply | Qty: 30 | Fill #0

## 2016-05-13 NOTE — Telephone Encounter (Signed)
Patient called the office to request 1 month supply for her losartan-hydrochlorothiazide (HYZAAR) 100-25 MG tablet and metoprolol succinate (TOPROL-XL) 50 MG 24 hr tablet. Pt has an appt on 3/12 but has run out of her medication. Please call it in to our pharmacy.  Thank you.

## 2016-05-13 NOTE — Telephone Encounter (Signed)
Refilled requested medications x 30 days

## 2016-05-26 ENCOUNTER — Telehealth: Payer: Self-pay | Admitting: Family Medicine

## 2016-05-26 DIAGNOSIS — I10 Essential (primary) hypertension: Secondary | ICD-10-CM

## 2016-05-26 MED ORDER — LOSARTAN POTASSIUM-HCTZ 100-25 MG PO TABS: 1.0000 | ORAL_TABLET | Freq: Every day | ORAL | 0 refills | Status: DC

## 2016-05-26 MED ORDER — METOPROLOL SUCCINATE ER 50 MG PO TB24: 50.0000 mg | ORAL_TABLET | Freq: Every day | ORAL | 0 refills | Status: DC

## 2016-05-26 NOTE — Telephone Encounter (Signed)
Blood pressure medications refilled x 30 days.

## 2016-05-26 NOTE — Telephone Encounter (Signed)
Patient is requesting a prescription refill of BP meds... She has an appointment scheduled in April

## 2016-05-27 ENCOUNTER — Encounter: Payer: Self-pay | Admitting: Family Medicine

## 2016-06-10 ENCOUNTER — Other Ambulatory Visit: Payer: Self-pay | Admitting: Family Medicine

## 2016-06-10 DIAGNOSIS — I10 Essential (primary) hypertension: Secondary | ICD-10-CM

## 2016-06-10 MED FILL — LOSARTAN-HCTZ 100-25 MG TAB: 100-25 | 30 days supply | Qty: 30 | Fill #0

## 2016-06-11 MED FILL — METOPROLOL SUCC ER 50 MG TA: 50 | 30 days supply | Qty: 30 | Fill #0

## 2016-06-24 ENCOUNTER — Ambulatory Visit: Payer: Self-pay | Attending: Family Medicine | Admitting: Family Medicine

## 2016-06-24 VITALS — BP 122/77 | HR 78 | Temp 98.3°F | Wt 198.8 lb

## 2016-06-24 DIAGNOSIS — Z1231 Encounter for screening mammogram for malignant neoplasm of breast: Secondary | ICD-10-CM

## 2016-06-24 DIAGNOSIS — Z23 Encounter for immunization: Secondary | ICD-10-CM

## 2016-06-24 DIAGNOSIS — E559 Vitamin D deficiency, unspecified: Secondary | ICD-10-CM | POA: Insufficient documentation

## 2016-06-24 DIAGNOSIS — Z79899 Other long term (current) drug therapy: Secondary | ICD-10-CM | POA: Insufficient documentation

## 2016-06-24 DIAGNOSIS — R7303 Prediabetes: Secondary | ICD-10-CM | POA: Insufficient documentation

## 2016-06-24 DIAGNOSIS — E669 Obesity, unspecified: Secondary | ICD-10-CM | POA: Insufficient documentation

## 2016-06-24 DIAGNOSIS — I10 Essential (primary) hypertension: Secondary | ICD-10-CM | POA: Insufficient documentation

## 2016-06-24 DIAGNOSIS — E785 Hyperlipidemia, unspecified: Secondary | ICD-10-CM | POA: Insufficient documentation

## 2016-06-24 DIAGNOSIS — E7800 Pure hypercholesterolemia, unspecified: Secondary | ICD-10-CM

## 2016-06-24 DIAGNOSIS — Z7982 Long term (current) use of aspirin: Secondary | ICD-10-CM | POA: Insufficient documentation

## 2016-06-24 LAB — POCT GLYCOSYLATED HEMOGLOBIN (HGB A1C): Hemoglobin A1C: 6.1

## 2016-06-24 MED ORDER — LOSARTAN POTASSIUM-HCTZ 100-25 MG PO TABS: 1.0000 | ORAL_TABLET | Freq: Every day | ORAL | 11 refills | Status: DC

## 2016-06-24 MED ORDER — METOPROLOL SUCCINATE ER 50 MG PO TB24: 50.0000 mg | ORAL_TABLET | Freq: Every day | ORAL | 11 refills | Status: DC

## 2016-06-24 NOTE — Assessment & Plan Note (Signed)
A; well controlled Med: compliant, continue toprol XL 50 mg daily and hyzaar 100-25 mg daily CMP today Continue daily ASA 81 mg for primary prevention

## 2016-06-24 NOTE — Progress Notes (Signed)
Patient ID: Paula Alexander, female   DOB: 05-Feb-1956, 61 y.o.   MRN: [id number]   Subjective:  Patient ID: Paula Alexander, female    DOB: Mar 10, 1956  Age: 61 y.o. MRN: [id number]  CC: Hypertension   HPI Paula Alexander has obesity, HTN, HLD she presents for follow up  1. CHRONIC HYPERTENSION  Disease Monitoring  Blood pressure range: not monitoring   Chest pain: no   Dyspnea: no   Claudication: no   Medication compliance: yes  Medication Side Effects  Lightheadedness: no   Urinary frequency: no     2. Prediabetes: she reports overeating. Minimal exercise. She denies polyuria or polydipsia. Denies vision changes.   Social History  Substance Use Topics  . Smoking status: Never Smoker  . Smokeless tobacco: Not on file  . Alcohol use No    Outpatient Medications Prior to Visit  Medication Sig Dispense Refill  . aspirin EC 81 MG tablet Take 81 mg by mouth daily.    Marland Kitchen losartan-hydrochlorothiazide (HYZAAR) 100-25 MG tablet TAKE 1 TABLET BY MOUTH DAILY. 30 tablet 0  . metoprolol succinate (TOPROL-XL) 50 MG 24 hr tablet TAKE 1 TABLET BY MOUTH DAILY. TAKE WITH OR IMMEDIATELY FOLLOWING A MEAL. 30 tablet 0  . fluconazole (DIFLUCAN) 150 MG tablet Take 1 tablet (150 mg total) by mouth once. (Patient not taking: Reported on 06/24/2016) 1 tablet 0  . Vitamin D, Ergocalciferol, (DRISDOL) 50000 units CAPS capsule Take 1 capsule (50,000 Units total) by mouth every 7 (seven) days. For 12 weeks (Patient not taking: Reported on 06/24/2016) 4 capsule 2   No facility-administered medications prior to visit.     ROS Review of Systems  Constitutional: Negative for chills and fever.  Eyes: Negative for visual disturbance.  Respiratory: Negative for shortness of breath.   Cardiovascular: Negative for chest pain.  Gastrointestinal: Negative for abdominal pain and blood in stool.  Musculoskeletal: Negative for arthralgias and back pain.  Skin: Negative for rash.  Allergic/Immunologic:  Negative for immunocompromised state.  Hematological: Negative for adenopathy. Does not bruise/bleed easily.  Psychiatric/Behavioral: Negative for dysphoric mood and suicidal ideas.    Objective:  BP 122/77   Pulse 78   Temp 98.3 F (36.8 C) (Oral)   Wt 198 lb 12.8 oz (90.2 kg)   SpO2 92%   BMI 35.78 kg/m   BP/Weight 06/24/2016 04/06/2015 0/37/5436  Systolic BP 067 703 403  Diastolic BP 77 70 72  Wt. (Lbs) 198.8 195 -  BMI 35.78 35.08 -    Physical Exam  Constitutional: She appears well-developed and well-nourished. No distress.  Cardiovascular: Normal rate, regular rhythm, normal heart sounds and intact distal pulses.   Pulmonary/Chest: Effort normal and breath sounds normal. Right breast exhibits no inverted nipple, no mass, no nipple discharge, no skin change and no tenderness. Left breast exhibits no inverted nipple, no mass, no nipple discharge, no skin change and no tenderness. Breasts are symmetrical.  Genitourinary: Rectum normal, vagina normal and uterus normal. Rectal exam shows no external hemorrhoid, no internal hemorrhoid, no fissure, no mass, no tenderness, anal tone normal and guaiac negative stool. Pelvic exam was performed with patient prone. There is no rash, tenderness or lesion on the right labia. There is no rash, tenderness or lesion on the left labia. Cervix exhibits no motion tenderness, no discharge and no friability.  Musculoskeletal: She exhibits no edema.  Lymphadenopathy:       Right: No inguinal adenopathy present.       Left: No inguinal  adenopathy present.  Skin: Skin is warm and dry. No rash noted.    Lab Results  Component Value Date   HGBA1C 6.3 (H) 06/30/2014   Lab Results  Component Value Date   HGBA1C 6.1 06/24/2016    Assessment & Plan:   Problem List Items Addressed This Visit      High   Prediabetes - Primary    Repeat A1c has improved Plan: Low carb diet Increase exercise Weight loss       Relevant Orders   HgB A1c  (Completed)   Obesity (BMI 30-39.9) (Chronic)   Hypertension (Chronic)    A; well controlled Med: compliant, continue toprol XL 50 mg daily and hyzaar 100-25 mg daily CMP today Continue daily ASA 81 mg for primary prevention        Relevant Medications   metoprolol succinate (TOPROL-XL) 50 MG 24 hr tablet   losartan-hydrochlorothiazide (HYZAAR) 100-25 MG tablet   Other Relevant Orders   CMP14+EGFR   Hyperlipidemia (Chronic)    Checking lipids       Relevant Medications   metoprolol succinate (TOPROL-XL) 50 MG 24 hr tablet   losartan-hydrochlorothiazide (HYZAAR) 100-25 MG tablet   Other Relevant Orders   Lipid Panel     Medium   Hypovitaminosis D    Repeat Vit D check       Relevant Orders   Vitamin D, 25-hydroxy    Other Visit Diagnoses    Visit for screening mammogram       Relevant Orders   MM DIGITAL SCREENING BILATERAL      No orders of the defined types were placed in this encounter.   Follow-up: Return in about 6 months (around 12/24/2016) for HTN .   Boykin Nearing MD

## 2016-06-24 NOTE — Patient Instructions (Addendum)
Paula Alexander was seen today for hypertension.  Diagnoses and all orders for this visit:  Prediabetes -     HgB A1c  Essential hypertension -     CMP14+EGFR -     metoprolol succinate (TOPROL-XL) 50 MG 24 hr tablet; Take 1 tablet (50 mg total) by mouth daily. Take with or immediately following a meal. -     losartan-hydrochlorothiazide (HYZAAR) 100-25 MG tablet; Take 1 tablet by mouth daily.  Pure hypercholesterolemia -     Lipid Panel  Hypovitaminosis D -     Vitamin D, 25-hydroxy  Visit for screening mammogram -     MM DIGITAL SCREENING BILATERAL; Future   Increase exercise to at least 150 minutes per week Reduce intake of sugar and carbohydrates (sweets, soda, juice, bread, pasta, potato, rice)   Please call Rolena Infante, [phone],  with the BCCCP (breast and cervical cancer control program) at the [facility] to set up an appointment to verify eligibility for a breast exam, mammogram, ultrasound. If you qualify this will be set up at [facility].  Return in 6 months for HTN follow up  Dr. Adrian Blackwater

## 2016-06-24 NOTE — Assessment & Plan Note (Addendum)
Repeat A1c has improved Plan: Low carb diet Increase exercise Weight loss

## 2016-06-24 NOTE — Assessment & Plan Note (Signed)
Repeat Vit D check

## 2016-06-24 NOTE — Assessment & Plan Note (Signed)
Checking lipids

## 2016-06-25 LAB — CMP14+EGFR
A/G RATIO: 1.6 (ref 1.2–2.2)
ALT: 17 IU/L (ref 0–32)
AST: 25 IU/L (ref 0–40)
Albumin: 4.2 g/dL (ref 3.6–4.8)
Alkaline Phosphatase: 95 IU/L (ref 39–117)
BUN/Creatinine Ratio: 18 (ref 12–28)
BUN: 18 mg/dL (ref 8–27)
Bilirubin Total: 0.3 mg/dL (ref 0.0–1.2)
CALCIUM: 9.6 mg/dL (ref 8.7–10.3)
CO2: 23 mmol/L (ref 18–29)
Chloride: 101 mmol/L (ref 96–106)
Creatinine, Ser: 1 mg/dL (ref 0.57–1.00)
GFR, EST AFRICAN AMERICAN: 70 mL/min/{1.73_m2} (ref >59)
GFR, EST NON AFRICAN AMERICAN: 61 mL/min/{1.73_m2} (ref >59)
Globulin, Total: 2.7 g/dL (ref 1.5–4.5)
Glucose: 100 mg/dL — ABNORMAL HIGH (ref 65–99)
Potassium: 3.9 mmol/L (ref 3.5–5.2)
Sodium: 139 mmol/L (ref 134–144)
TOTAL PROTEIN: 6.9 g/dL (ref 6.0–8.5)

## 2016-06-25 LAB — LIPID PANEL
CHOL/HDL RATIO: 3.7 ratio (ref 0.0–4.4)
Cholesterol, Total: 194 mg/dL (ref 100–199)
HDL: 53 mg/dL (ref >39)
LDL Calculated: 120 mg/dL — ABNORMAL HIGH (ref 0–99)
TRIGLYCERIDES: 106 mg/dL (ref 0–149)
VLDL CHOLESTEROL CAL: 21 mg/dL (ref 5–40)

## 2016-06-25 LAB — VITAMIN D 25 HYDROXY (VIT D DEFICIENCY, FRACTURES): VIT D 25 HYDROXY: 13.7 ng/mL — AB (ref 30.0–100.0)

## 2016-07-02 ENCOUNTER — Encounter: Payer: Self-pay | Admitting: Family Medicine

## 2016-07-02 MED ORDER — VITAMIN D (ERGOCALCIFEROL) 1.25 MG (50000 UNIT) PO CAPS: 50000.0000 [IU] | ORAL_CAPSULE | ORAL | 0 refills | Status: DC

## 2016-07-02 MED ORDER — ATORVASTATIN CALCIUM 20 MG PO TABS: 20.0000 mg | ORAL_TABLET | Freq: Every day | ORAL | 3 refills | Status: DC

## 2016-07-02 NOTE — Addendum Note (Signed)
Addended by: Dessa Phi on: 07/02/2016 10:41 PM   Modules accepted: Orders

## 2016-07-03 MED FILL — VIT D2 1.25 MG (50,000 UNIT: 1.25 MG | 28 days supply | Qty: 4 | Fill #0

## 2016-07-03 MED FILL — ATORVASTATIN 20 MG TABLET: 20 | 30 days supply | Qty: 30 | Fill #0

## 2016-07-09 ENCOUNTER — Telehealth: Payer: Self-pay

## 2016-07-09 MED FILL — ?METOPROLOL SUCC ER 50 MG: 50 MG | 30 days supply | Qty: 30 | Fill #0

## 2016-07-09 MED FILL — LOSARTAN-HCTZ 100-25 MG TAB: 100-25 | 30 days supply | Qty: 30 | Fill #0

## 2016-07-09 NOTE — Telephone Encounter (Signed)
Pt was called and informed of lab reuslts

## 2016-07-30 ENCOUNTER — Encounter: Payer: Self-pay | Admitting: Family Medicine

## 2016-08-12 MED FILL — LOSARTAN-HCTZ 100-25 MG TAB: 100-25 | 30 days supply | Qty: 30 | Fill #1

## 2016-08-12 MED FILL — ?METOPROLOL SUCC ER 50 MG: 50 MG | 30 days supply | Qty: 30 | Fill #1

## 2016-09-01 MED FILL — VIT D2 1.25 MG (50,000 UNIT: 1.25 MG | 28 days supply | Qty: 4 | Fill #1

## 2016-09-11 MED FILL — ?ATORVASTATIN 20 MG TABLET: 20 | 30 days supply | Qty: 30 | Fill #1

## 2016-09-11 MED FILL — LOSARTAN-HCTZ 100-25 MG TAB: 100-25 | 30 days supply | Qty: 30 | Fill #2

## 2016-09-11 MED FILL — METOPROLOL SUCC ER 50 MG TA: 50 | 30 days supply | Qty: 30 | Fill #2

## 2016-10-01 MED FILL — VIT D2 1.25 MG (50,000 UNIT: 1.25 MG | 28 days supply | Qty: 4 | Fill #2

## 2016-10-14 MED FILL — METOPROLOL SUCC ER 50 MG TA: 50 | 30 days supply | Qty: 30 | Fill #3

## 2016-10-14 MED FILL — LOSARTAN-HCTZ 100-25 MG TAB: 100-25 | 30 days supply | Qty: 30 | Fill #3

## 2016-10-14 MED FILL — ?ATORVASTATIN 20 MG TABLET: 20 | 30 days supply | Qty: 30 | Fill #2

## 2016-11-12 MED FILL — LOSARTAN-HCTZ 100-25 MG TAB: 100-25 | 30 days supply | Qty: 30 | Fill #4

## 2016-11-12 MED FILL — METOPROLOL SUCC ER 50 MG TA: 50 | 30 days supply | Qty: 30 | Fill #4

## 2016-11-12 MED FILL — ?ATORVASTATIN 20 MG TABLET: 20 | 30 days supply | Qty: 30 | Fill #3

## 2016-12-17 MED FILL — METOPROLOL SUCC ER 50 MG TA: 50 | 30 days supply | Qty: 30 | Fill #5

## 2016-12-17 MED FILL — LOSARTAN-HCTZ 100-25 MG TAB: 100-25 | 30 days supply | Qty: 30 | Fill #5

## 2016-12-17 MED FILL — ?ATORVASTATIN 20 MG TABLET: 20 | 30 days supply | Qty: 30 | Fill #4

## 2017-01-21 MED FILL — ?ATORVASTATIN 20 MG TABLET: 20 | 30 days supply | Qty: 30 | Fill #5

## 2017-01-21 MED FILL — METOPROLOL SUCC ER 50 MG TA: 50 | 30 days supply | Qty: 30 | Fill #6

## 2017-01-21 MED FILL — LOSARTAN-HCTZ 100-25 MG TAB: 100-25 | 30 days supply | Qty: 30 | Fill #6

## 2017-02-16 ENCOUNTER — Ambulatory Visit: Payer: Self-pay | Attending: Internal Medicine

## 2017-02-16 MED FILL — ?ATORVASTATIN 20 MG TABLET: 20 | 30 days supply | Qty: 30 | Fill #6

## 2017-02-16 MED FILL — METOPROLOL SUCC ER 50 MG TA: 50 | 30 days supply | Qty: 30 | Fill #7

## 2017-02-16 MED FILL — LOSARTAN-HCTZ 100-25 MG TAB: 100-25 | 30 days supply | Qty: 30 | Fill #7

## 2017-02-27 ENCOUNTER — Ambulatory Visit: Payer: Self-pay | Attending: Nurse Practitioner | Admitting: Nurse Practitioner

## 2017-02-27 ENCOUNTER — Other Ambulatory Visit: Payer: Self-pay

## 2017-02-27 ENCOUNTER — Encounter: Payer: Self-pay | Admitting: Nurse Practitioner

## 2017-02-27 VITALS — BP 116/78 | HR 70 | Temp 98.5°F | Resp 12 | Wt 196.8 lb

## 2017-02-27 DIAGNOSIS — E669 Obesity, unspecified: Secondary | ICD-10-CM | POA: Insufficient documentation

## 2017-02-27 DIAGNOSIS — Z8249 Family history of ischemic heart disease and other diseases of the circulatory system: Secondary | ICD-10-CM | POA: Insufficient documentation

## 2017-02-27 DIAGNOSIS — M797 Fibromyalgia: Secondary | ICD-10-CM | POA: Insufficient documentation

## 2017-02-27 DIAGNOSIS — I10 Essential (primary) hypertension: Secondary | ICD-10-CM | POA: Insufficient documentation

## 2017-02-27 DIAGNOSIS — E559 Vitamin D deficiency, unspecified: Secondary | ICD-10-CM | POA: Insufficient documentation

## 2017-02-27 DIAGNOSIS — Z809 Family history of malignant neoplasm, unspecified: Secondary | ICD-10-CM | POA: Insufficient documentation

## 2017-02-27 DIAGNOSIS — R7303 Prediabetes: Secondary | ICD-10-CM | POA: Insufficient documentation

## 2017-02-27 DIAGNOSIS — Z79899 Other long term (current) drug therapy: Secondary | ICD-10-CM | POA: Insufficient documentation

## 2017-02-27 DIAGNOSIS — Z7982 Long term (current) use of aspirin: Secondary | ICD-10-CM | POA: Insufficient documentation

## 2017-02-27 DIAGNOSIS — E782 Mixed hyperlipidemia: Secondary | ICD-10-CM | POA: Insufficient documentation

## 2017-02-27 DIAGNOSIS — Z823 Family history of stroke: Secondary | ICD-10-CM | POA: Insufficient documentation

## 2017-02-27 LAB — GLUCOSE, POCT (MANUAL RESULT ENTRY): POC GLUCOSE: 110 mg/dL — AB (ref 70–99)

## 2017-02-27 LAB — POCT GLYCOSYLATED HEMOGLOBIN (HGB A1C): HEMOGLOBIN A1C: 6.3

## 2017-02-27 MED ORDER — LOSARTAN POTASSIUM-HCTZ 100-25 MG PO TABS: 1.0000 | ORAL_TABLET | Freq: Every day | ORAL | 1 refills | Status: DC

## 2017-02-27 MED ORDER — METOPROLOL SUCCINATE ER 50 MG PO TB24: 50.0000 mg | ORAL_TABLET | Freq: Every day | ORAL | 1 refills | Status: DC

## 2017-02-27 MED ORDER — VITAMIN D (ERGOCALCIFEROL) 1.25 MG (50000 UNIT) PO CAPS: 50000.0000 [IU] | ORAL_CAPSULE | ORAL | 1 refills | Status: DC

## 2017-02-27 MED ORDER — ATORVASTATIN CALCIUM 20 MG PO TABS: 20.0000 mg | ORAL_TABLET | Freq: Every day | ORAL | 3 refills | Status: DC

## 2017-02-27 MED FILL — VIT D2 1.25 MG (50,000 UNIT: 1.25 MG | 28 days supply | Qty: 4 | Fill #0

## 2017-02-27 NOTE — Progress Notes (Signed)
Assessment & Plan:  .Paula Dominionearlie was seen today for establish care.  Diagnoses and all orders for this visit:  Prediabetes -     Glucose (CBG) -     HgB A1c If A1c continues to increase may need to add metformin. Discussed with patient.    Essential hypertension -     losartan-hydrochlorothiazide (HYZAAR) 100-25 MG tablet; Take 1 tablet by mouth daily. -     metoprolol succinate (TOPROL-XL) 50 MG 24 hr tablet; Take 1 tablet (50 mg total) by mouth daily. Take with or immediately following a meal. Continue all antihypertensives as prescribed.  Remember to bring in your blood pressure log with you for your follow up appointment.  DASH/Mediterranean Diets are healthier choices for HTN.   Mixed hyperlipidemia -     atorvastatin (LIPITOR) 20 MG tablet; Take 1 tablet (20 mg total) by mouth daily.  Obesity (BMI 30-39.9) Discussed diet and exercise for person with BMI >25. Instructed: You must burn more calories than you eat. Losing 5 percent of your body weight should be considered a success. In the longer term, losing more than 15 percent of your body weight and staying at this weight is an extremely good result. However, keep in mind that even losing 5 percent of your body weight leads to important health benefits, so try not to get discouraged if you're not able to lose more than this. Will recheck weight in 3-6 months.  Vitamin D deficiency -     VITAMIN D 25 Hydroxy (Vit-D Deficiency, Fractures) -     Vitamin D, Ergocalciferol, (DRISDOL) 50000 units CAPS capsule; Take 1 capsule (50,000 Units total) by mouth every 7 (seven) days. For 12 weeks     Patient has been counseled on age-appropriate routine health concerns for screening and prevention. These are reviewed and up-to-date. Referrals have been placed accordingly. Immunizations are up-to-date or declined.    Subjective:   Chief Complaint  Patient presents with  . Establish Care   HPI Paula Alexander 61 y.o. female presents  to office today to establish care.   Vitamin D deficiency She reports finishing her course of weekly prescribed vitamin D a few months ago. Will need to recheck vitamin D level today. Her last Vitamin D level on 06-24-2016 was 13.7. After she completed her course of Vitamin D she did not start any OTC Vitamin D.   Essential Hypertension This is a longstanding condition for Paula Alexander. She has questions today regarding the recalls on her losartan/htctz and metoprolol. I have answered her questions to the best of my ability and also deferred her to the pharmacist for additional questions. She is checking her blood pressure 1-2 times per day. Reports readings similar to today's. She is not as diet compliant as she should be. We discussed DASH Diet today as well as exercise modifications. She currently denies chest pain, shortness of breath, lightheadedness, palpitations, headaches, vomiting or bilateral lower extremity edema.   Prediabetes She was informed of her prediabetes at her last office appointment in April. Since then she reports she has cut back on unhealthy snacking. She is still drinking regular sodas twice a week. She is aware that she should not be drinking sodas or sugary drinks. She is eating more lean meats and avoiding fried foods.  Glucose level today was 110. Lab Results  Component Value Date   HGBA1C 6.3 02/27/2017     Hyperlipidemia She takes atorvastatin as prescribed. Denies any statin intolerance or myalgias.  Lab Results  Component Value Date   CHOL 194 06/24/2016   CHOL 200 04/06/2015   CHOL 178 05/24/2013   Lab Results  Component Value Date   HDL 53 06/24/2016   HDL 60 04/06/2015   HDL 42 05/24/2013   Lab Results  Component Value Date   LDLCALC 120 (H) 06/24/2016   LDLCALC 108 04/06/2015   LDLCALC 116 (H) 05/24/2013   Lab Results  Component Value Date   TRIG 106 06/24/2016   TRIG 161 (H) 04/06/2015   TRIG 101 05/24/2013   Lab Results  Component Value  Date   CHOLHDL 3.7 06/24/2016   CHOLHDL 3.3 04/06/2015   CHOLHDL 4.2 05/24/2013    Review of Systems  Constitutional: Negative for fever, malaise/fatigue and weight loss.  HENT: Negative.  Negative for nosebleeds.   Eyes: Negative.  Negative for blurred vision, double vision and photophobia.  Respiratory: Negative.  Negative for cough and shortness of breath.   Cardiovascular: Negative.  Negative for chest pain, palpitations and leg swelling.  Gastrointestinal: Negative.  Negative for abdominal pain, constipation, diarrhea, heartburn, nausea and vomiting.  Musculoskeletal: Negative.  Negative for myalgias.  Neurological: Negative.  Negative for dizziness, focal weakness, seizures and headaches.  Endo/Heme/Allergies: Negative for environmental allergies.  Psychiatric/Behavioral: Negative.  Negative for suicidal ideas.    Past Medical History:  Diagnosis Date  . Calcium deficiency   . Fibromyalgia   . Heart murmur   . Hyperlipidemia   . Hypertension     No past surgical history on file.  Family History  Problem Relation Age of Onset  . Cancer Mother   . Hypertension Mother   . Stroke Mother   . Cancer Sister   . Multiple sclerosis Sister   . Diabetes Son     Social History Reviewed with no changes to be made today.   Outpatient Medications Prior to Visit  Medication Sig Dispense Refill  . aspirin EC 81 MG tablet Take 81 mg by mouth daily.    Marland Kitchen. atorvastatin (LIPITOR) 20 MG tablet Take 1 tablet (20 mg total) by mouth daily. 90 tablet 3  . losartan-hydrochlorothiazide (HYZAAR) 100-25 MG tablet Take 1 tablet by mouth daily. 30 tablet 11  . metoprolol succinate (TOPROL-XL) 50 MG 24 hr tablet Take 1 tablet (50 mg total) by mouth daily. Take with or immediately following a meal. 30 tablet 11  . Vitamin D, Ergocalciferol, (DRISDOL) 50000 units CAPS capsule Take 1 capsule (50,000 Units total) by mouth every 7 (seven) days. For 12 weeks 12 capsule 0   No facility-administered  medications prior to visit.     No Known Allergies     Objective:    BP 116/78 (BP Location: Right Arm, Patient Position: Sitting, Cuff Size: Large)   Pulse 70   Temp 98.5 F (36.9 C) (Oral)   Resp 12   Wt 196 lb 12.8 oz (89.3 kg)   SpO2 97%   BMI 35.42 kg/m  Wt Readings from Last 3 Encounters:  02/27/17 196 lb 12.8 oz (89.3 kg)  06/24/16 198 lb 12.8 oz (90.2 kg)  04/06/15 195 lb (88.5 kg)    Physical Exam  Constitutional: She is oriented to person, place, and time. She appears well-developed and well-nourished. She is cooperative.  HENT:  Head: Normocephalic and atraumatic.  Eyes: EOM are normal.  Neck: Normal range of motion.  Cardiovascular: Normal rate, regular rhythm and intact distal pulses. Exam reveals no gallop and no friction rub.  Murmur heard. Pulmonary/Chest: Effort normal and breath sounds normal.  No tachypnea. No respiratory distress. She has no decreased breath sounds. She has no wheezes. She has no rhonchi. She has no rales. She exhibits no tenderness.  Abdominal: Soft. Bowel sounds are normal.  Musculoskeletal: Normal range of motion. She exhibits no edema.  Neurological: She is alert and oriented to person, place, and time. Coordination normal.  Skin: Skin is warm and dry.  Psychiatric: She has a normal mood and affect. Her behavior is normal. Judgment and thought content normal.  Nursing note and vitals reviewed.        Patient has been counseled extensively about nutrition and exercise as well as the importance of adherence with medications and regular follow-up. The patient was given clear instructions to go to ER or return to medical center if symptoms don't improve, worsen or new problems develop. The patient verbalized understanding.   Follow-up: Return in about 3 months (around 05/28/2017) for HTN/HPL/DM and physical .   Claiborne Rigg, FNP-BC [facility] [place], [place] [phone]   02/27/2017, 1:40  PM

## 2017-02-27 NOTE — Progress Notes (Signed)
Pt here to establish care.

## 2017-02-28 LAB — VITAMIN D 25 HYDROXY (VIT D DEFICIENCY, FRACTURES): VIT D 25 HYDROXY: 22.2 ng/mL — AB (ref 30.0–100.0)

## 2017-03-18 ENCOUNTER — Telehealth: Payer: Self-pay

## 2017-03-18 NOTE — Telephone Encounter (Signed)
Patient informed on lab result.   Patient verified DOB.  

## 2017-03-18 NOTE — Telephone Encounter (Signed)
-----   Message from Claiborne RiggZelda W Fleming, NP sent at 03/17/2017 10:29 PM EST ----- Vit. D has improved however still abnormal. Please continue vitamin d as prescribed.

## 2017-03-27 MED FILL — METOPROLOL SUCC ER 50 MG TA: 50 | 30 days supply | Qty: 30 | Fill #8

## 2017-03-27 MED FILL — ?ATORVASTATIN 20 MG TABLET: 20 | 30 days supply | Qty: 30 | Fill #7

## 2017-03-27 MED FILL — LOSARTAN-HCTZ 100-25 MG TAB: 100-25 | 30 days supply | Qty: 30 | Fill #8

## 2017-04-15 MED FILL — VIT D2 1.25 MG (50,000 UNIT: 1.25 MG | 28 days supply | Qty: 4 | Fill #1

## 2017-04-29 MED FILL — METOPROLOL SUCCINATE ER 50: 50 | 30 days supply | Qty: 30 | Fill #9

## 2017-04-29 MED FILL — ?ATORVASTATIN 20MG TABL: 20 | 30 days supply | Qty: 30 | Fill #8

## 2017-04-29 MED FILL — LOSARTAN-HCTZ 100-25 MG TAB: 100-25 | 30 days supply | Qty: 30 | Fill #9

## 2017-05-15 MED FILL — VIT D2 1.25 MG (50,000 UNIT: 1.25 MG | 28 days supply | Qty: 4 | Fill #2

## 2017-05-25 MED FILL — LOSARTAN-HCTZ 100-25 MG TAB: 100-25 | 30 days supply | Qty: 30 | Fill #10

## 2017-05-25 MED FILL — VIT D2 1.25 MG (50,000 UNIT: 1.25 MG | 28 days supply | Qty: 4 | Fill #3

## 2017-05-25 MED FILL — METOPROLOL SUCCINATE ER 50: 50 | 30 days supply | Qty: 30 | Fill #10

## 2017-05-25 MED FILL — ?ATORVASTATIN 20 MG TABLET: 20 | 30 days supply | Qty: 30 | Fill #9

## 2017-06-12 ENCOUNTER — Ambulatory Visit: Payer: Self-pay | Admitting: Nurse Practitioner

## 2017-06-25 MED FILL — VIT D2 1.25 MG (50,000 UNIT: 1.25 MG | 28 days supply | Qty: 4 | Fill #4

## 2017-06-25 MED FILL — ?ATORVASTATIN 20 MG TABLET: 20 | 30 days supply | Qty: 30 | Fill #10

## 2017-06-25 MED FILL — METOPROLOL SUCCINATE ER 50: 50 | 30 days supply | Qty: 30 | Fill #0

## 2017-06-25 MED FILL — LOSARTAN-HCTZ 100-25 MG TAB: 100-25 | 30 days supply | Qty: 30 | Fill #0

## 2017-07-17 ENCOUNTER — Ambulatory Visit: Payer: Self-pay | Admitting: Nurse Practitioner

## 2017-07-25 ENCOUNTER — Emergency Department
Admission: EM | Admit: 2017-07-25 | Discharge: 2017-07-25 | Disposition: A | Discharge status: Home / Self Care | Payer: Self-pay | Attending: Emergency Medicine | Admitting: Emergency Medicine

## 2017-07-25 ENCOUNTER — Encounter: Payer: Self-pay | Admitting: Emergency Medicine

## 2017-07-25 DIAGNOSIS — Z7982 Long term (current) use of aspirin: Secondary | ICD-10-CM | POA: Insufficient documentation

## 2017-07-25 DIAGNOSIS — Z8679 Personal history of other diseases of the circulatory system: Secondary | ICD-10-CM | POA: Insufficient documentation

## 2017-07-25 DIAGNOSIS — L02412 Cutaneous abscess of left axilla: Secondary | ICD-10-CM | POA: Insufficient documentation

## 2017-07-25 DIAGNOSIS — R5383 Other fatigue: Secondary | ICD-10-CM | POA: Insufficient documentation

## 2017-07-25 DIAGNOSIS — Z79899 Other long term (current) drug therapy: Secondary | ICD-10-CM | POA: Insufficient documentation

## 2017-07-25 DIAGNOSIS — I10 Essential (primary) hypertension: Secondary | ICD-10-CM | POA: Insufficient documentation

## 2017-07-25 MED ORDER — LIDOCAINE-EPINEPHRINE (PF) 2 %-1:200000 IJ SOLN
20.0000 mL | Freq: Once | INTRAMUSCULAR | Status: AC
  Administered 2017-07-25: 20 mL
  Filled 2017-07-25: qty 20

## 2017-07-25 MED ORDER — CLINDAMYCIN HCL 300 MG PO CAPS: 300.0000 mg | ORAL_CAPSULE | Freq: Three times a day (TID) | ORAL | 0 refills | Status: DC

## 2017-07-25 MED ORDER — LIDOCAINE-EPINEPHRINE (PF) 2 %-1:200000 IJ SOLN
10.0000 mL | Freq: Once | INTRAMUSCULAR | Status: DC
  Filled 2017-07-25: qty 10

## 2017-07-25 MED ORDER — NAPROXEN 500 MG PO TABS
500.0000 mg | ORAL_TABLET | Freq: Once | ORAL | Status: AC
Start: 2017-07-25 — End: 2017-07-25
  Administered 2017-07-25: 500 mg via ORAL
  Filled 2017-07-25: qty 1

## 2017-07-25 NOTE — ED Triage Notes (Signed)
Pt co left armpit abscess soreness that she has had repeatedly drained and it keeps coming back.  She is co 8/10 pain.

## 2017-07-25 NOTE — ED Notes (Signed)
md in to I and d abscess.

## 2017-07-25 NOTE — Discharge Instructions (Signed)
You have been seen in the Emergency Department (ED) today for an abscess.  This was drained in the ED. ° °Please follow up with your doctor or in the ED in 24-48 hours for recheck of your wound.  Read through the additional discharge instructions included below regarding wound care recommendations. ° °Call your doctor sooner or return to the ED if you develop worsening signs of infection such as: increased redness, increased pain, pus, or fever. ° °

## 2017-07-25 NOTE — ED Provider Notes (Signed)
[facility] Emergency Department Provider Note   ____________________________________________   First MD Initiated Contact with Patient 07/25/17 0255     (approximate)  I have reviewed the triage vital signs and the nursing notes.   HISTORY  Chief Complaint Abscess (Left Armpit)    HPI Paula Alexander is a 62 y.o. female here for evaluation of a abscess under her left armpit.  Reports is been there previously and had to be drained in the past once before.  Reports pain is about 8-10 is located in the left armpit.  No associated nausea or vomiting.  No fevers or chills.  Some fatigue this week.  Has not taken any antibiotics or taken any new medicines temperature is alleviated. Reports he had to have it surgically drained before  Past Medical History:  Diagnosis Date  . Calcium deficiency   . Heart murmur   . Hyperlipidemia   . Hypertension     Patient Active Problem List   Diagnosis Date Noted  . Obesity (BMI 30-39.9) 06/24/2016  . Prediabetes 04/08/2015  . Large breasts 04/06/2015  . Vitamin D deficiency 05/27/2013  . PVD (peripheral vascular disease) (HCC) 06/22/2012  . Retinopathy 06/22/2012  . Hypertension 02/04/2012  . Hyperlipidemia 02/04/2012  . Heart murmur   . Fibromyalgia     History reviewed. No pertinent surgical history.  Prior to Admission medications   Medication Sig Start Date End Date Taking? Authorizing Provider  aspirin EC 81 MG tablet Take 81 mg by mouth daily.    [provider]  atorvastatin (LIPITOR) 20 MG tablet Take 1 tablet (20 mg total) by mouth daily. 02/27/17   Claiborne Rigg, NP  clindamycin (CLEOCIN) 300 MG capsule Take 1 capsule (300 mg total) by mouth 3 (three) times daily. 07/25/17   Sharyn Creamer, MD  losartan-hydrochlorothiazide (HYZAAR) 100-25 MG tablet Take 1 tablet by mouth daily. 02/27/17 05/28/17  Claiborne Rigg, NP  metoprolol succinate (TOPROL-XL) 50 MG 24 hr tablet Take 1 tablet (50  mg total) by mouth daily. Take with or immediately following a meal. 02/27/17 05/28/17  Claiborne Rigg, NP  Vitamin D, Ergocalciferol, (DRISDOL) 50000 units CAPS capsule Take 1 capsule (50,000 Units total) by mouth every 7 (seven) days. For 12 weeks 02/27/17   Claiborne Rigg, NP  losartan (COZAAR) 100 MG tablet Take 1 tablet (100 mg total) by mouth daily. 10/24/11 02/03/12  Linwood Dibbles, MD  metoprolol (LOPRESSOR) 50 MG tablet Take 1 tablet (50 mg total) by mouth daily. 02/03/12 02/03/12  Cleora Fleet, MD    Allergies Patient has no known allergies.  Family History  Problem Relation Age of Onset  . Cancer Mother   . Hypertension Mother   . Stroke Mother   . Cancer Sister   . Multiple sclerosis Sister   . Diabetes Son     Social History Social History   Tobacco Use  . Smoking status: Never Smoker  . Smokeless tobacco: Never Used  Substance Use Topics  . Alcohol use: No  . Drug use: No    Review of Systems Constitutional: No fever/chills Eyes: No visual changes. ENT: No sore throat. Cardiovascular: Denies chest pain. Respiratory: Denies shortness of breath. Gastrointestinal: No abdominal pain.  No nausea, no vomiting.  No diarrhea.  No constipation. Genitourinary: Negative for dysuria. Musculoskeletal: Negative for back pain. Skin: See HPI Neurological: Negative for headaches, focal weakness or numbness.    ____________________________________________   PHYSICAL EXAM:  VITAL SIGNS: ED Triage Vitals [  07/25/17 0005]  Enc Vitals Group     BP 96/73     Pulse Rate 80     Resp 16     Temp 99.8 F (37.7 C)     Temp Source Oral     SpO2 100 %     Weight      Height      Head Circumference      Peak Flow      Pain Score 8     Pain Loc      Pain Edu?      Excl. in GC?     Constitutional: Alert and oriented. Well appearing and in no acute distress. Eyes: Conjunctivae are normal. Head: Atraumatic. Nose: No congestion/rhinnorhea. Mouth/Throat: Mucous  membranes are moist. Neck: No stridor.   Cardiovascular: Normal rate, regular rhythm. Grossly normal heart sounds.  Good peripheral circulation.  The left axilla demonstrates a rather large probably half centimeter sized boil, and is slightly fluctuant with a somewhat hard surrounding wall at the edges.  It is erythematous and warm to touch and tender. Respiratory: Normal respiratory effort.  No retractions. Lungs CTAB. Gastrointestinal: Soft and nontender. No distention. Musculoskeletal: No lower extremity tenderness nor edema. Neurologic:  Normal speech and language. No gross focal neurologic deficits are appreciated.  Skin:  Skin is warm, dry and intact. No rash noted. Psychiatric: Mood and affect are normal. Speech and behavior are normal.  ____________________________________________   LABS (all labs ordered are listed, but only abnormal results are displayed)  Labs Reviewed - No data to display ____________________________________________  EKG   ____________________________________________  RADIOLOGY   ____________________________________________   PROCEDURES  Procedure(s) performed: I and D   ..Incision and Drainage Date/Time: 07/25/2017 3:49 AM Performed by: Sharyn Creamer, MD Authorized by: Sharyn Creamer, MD   Consent:    Consent obtained:  Verbal   Consent given by:  Patient   Risks discussed:  Bleeding, infection, incomplete drainage and pain   Alternatives discussed:  Observation and delayed treatment Location:    Type:  Abscess   Size:  3   Location:  Trunk   Trunk location:  Chest Pre-procedure details:    Skin preparation:  Betadine Anesthesia (see MAR for exact dosages):    Anesthesia method:  Local infiltration   Local anesthetic:  Lidocaine 2% WITH epi Procedure type:    Complexity:  Complex Procedure details:    Needle aspiration: no     Incision types:  Stab incision   Incision depth:  Submucosal   Scalpel blade:  11   Wound management:   Probed and deloculated and irrigated with saline   Drainage:  Purulent   Drainage amount:  Moderate   Wound treatment:  Wound left open   Packing materials:  1/2 in gauze   Amount 1/2":  1 foot, plus 6 inches taped to the outside bandage Post-procedure details:    Patient tolerance of procedure:  Tolerated well, no immediate complications    Critical Care performed:   ____________________________________________   INITIAL IMPRESSION / ASSESSMENT AND PLAN / ED COURSE  Pertinent labs & imaging results that were available during my care of the patient were reviewed by me and considered in my medical decision making (see chart for details).  Patient presents for evaluation of the l left armpit pain.  She has evidence of an abscess.  Successfully I&D 8 with good purulent drainage.  Discussed various antibiotics including the risks benefits and side effects with patient and her daughter, including clindamycin.  After discussing risks and benefits pertinent to clindamycin including C. difficile in particular, after weighing risks and benefits of alternative therapies daughter and patient agreeable for treatment with clindamycin.  She will be able to follow-up closely with her primary care doctor.  Recommended recheck in about 2 to 3 days. Return precautions and treatment recommendations and follow-up discussed with the patient who is agreeable with the plan.      ____________________________________________   FINAL CLINICAL IMPRESSION(S) / ED DIAGNOSES  Final diagnoses:  Abscess of left axilla      NEW MEDICATIONS STARTED DURING THIS VISIT:  New Prescriptions   CLINDAMYCIN (CLEOCIN) 300 MG CAPSULE    Take 1 capsule (300 mg total) by mouth 3 (three) times daily.     Note:  This document was prepared using Dragon voice recognition software and may include unintentional dictation errors.     Sharyn Creamer, MD 07/25/17 [phone]

## 2017-07-27 MED FILL — LOSARTAN-HCTZ 100-25 MG TAB: 100-25 | 30 days supply | Qty: 30 | Fill #1

## 2017-07-27 MED FILL — VIT D2 1.25 MG (50,000 UNIT: 1.25 MG | 28 days supply | Qty: 4 | Fill #5

## 2017-07-27 MED FILL — METOPROLOL SUCCINATE ER 50: 50 | 30 days supply | Qty: 30 | Fill #1

## 2017-07-27 MED FILL — CLINDAMYCIN HCL 300 MG CAP: 300 | 10 days supply | Qty: 30 | Fill #0

## 2017-07-27 MED FILL — ?ATORVASTATIN 20 MG TABLET: 20 | 30 days supply | Qty: 30 | Fill #0

## 2017-07-28 ENCOUNTER — Ambulatory Visit: Payer: Self-pay | Attending: Family Medicine | Admitting: Family Medicine

## 2017-07-28 ENCOUNTER — Encounter: Payer: Self-pay | Admitting: Family Medicine

## 2017-07-28 VITALS — BP 150/80 | HR 70 | Temp 97.6°F | Wt 192.6 lb

## 2017-07-28 DIAGNOSIS — R7303 Prediabetes: Secondary | ICD-10-CM | POA: Insufficient documentation

## 2017-07-28 DIAGNOSIS — Z7982 Long term (current) use of aspirin: Secondary | ICD-10-CM | POA: Insufficient documentation

## 2017-07-28 DIAGNOSIS — Z79899 Other long term (current) drug therapy: Secondary | ICD-10-CM | POA: Insufficient documentation

## 2017-07-28 DIAGNOSIS — L0291 Cutaneous abscess, unspecified: Secondary | ICD-10-CM | POA: Insufficient documentation

## 2017-07-28 DIAGNOSIS — E785 Hyperlipidemia, unspecified: Secondary | ICD-10-CM | POA: Insufficient documentation

## 2017-07-28 DIAGNOSIS — I10 Essential (primary) hypertension: Secondary | ICD-10-CM | POA: Insufficient documentation

## 2017-07-28 NOTE — Progress Notes (Signed)
Subjective:  Patient ID: Paula Alexander, female    DOB: [birth year]  Age: 62 y.o. MRN: [id number]  CC: Hospitalization Follow-up   HPI Paula Alexander is a 62 year old female with a history of prediabetes (A1c 6.3), hypertension who was seen at the ED for a left axillary abscess status post incision and drainage and packing with iodoform gauze 4 days ago and comes in today for a reassessment. She does have pain in her left axilla but not as intense as presentation.  Denies fevers, lightheadedness. Her blood pressure is elevated today and she endorses being nervous about the procedure.  Past Medical History:  Diagnosis Date  . Calcium deficiency   . Heart murmur   . Hyperlipidemia   . Hypertension     No past surgical history on file.  No Known Allergies   Outpatient Medications Prior to Visit  Medication Sig Dispense Refill  . aspirin EC 81 MG tablet Take 81 mg by mouth daily.    Marland Kitchen atorvastatin (LIPITOR) 20 MG tablet Take 1 tablet (20 mg total) by mouth daily. 90 tablet 3  . clindamycin (CLEOCIN) 300 MG capsule Take 1 capsule (300 mg total) by mouth 3 (three) times daily. 30 capsule 0  . Vitamin D, Ergocalciferol, (DRISDOL) 50000 units CAPS capsule Take 1 capsule (50,000 Units total) by mouth every 7 (seven) days. For 12 weeks 12 capsule 1  . losartan-hydrochlorothiazide (HYZAAR) 100-25 MG tablet Take 1 tablet by mouth daily. 90 tablet 1  . metoprolol succinate (TOPROL-XL) 50 MG 24 hr tablet Take 1 tablet (50 mg total) by mouth daily. Take with or immediately following a meal. 90 tablet 1   No facility-administered medications prior to visit.     ROS Review of Systems  Constitutional: Negative for activity change, appetite change and fatigue.  HENT: Negative for congestion, sinus pressure and sore throat.   Eyes: Negative for visual disturbance.  Respiratory: Negative for cough, chest tightness, shortness of breath and wheezing.   Cardiovascular: Negative for chest  pain and palpitations.  Gastrointestinal: Negative for abdominal distention, abdominal pain and constipation.  Endocrine: Negative for polydipsia.  Genitourinary: Negative for dysuria and frequency.  Musculoskeletal: Negative for arthralgias and back pain.  Skin:       See hpi  Neurological: Negative for tremors, light-headedness and numbness.  Hematological: Does not bruise/bleed easily.  Psychiatric/Behavioral: Negative for agitation and behavioral problems.    Objective:  BP (!) 150/80   Pulse 70   Temp 97.6 F (36.4 C) (Oral)   Wt 192 lb 9.6 oz (87.4 kg)   SpO2 99%   BMI 34.67 kg/m   BP/Weight 07/28/2017 07/25/2017 02/27/2017  Systolic BP 150 125 116  Diastolic BP 80 77 78  Wt. (Lbs) 192.6 - 196.8  BMI 34.67 - 35.42      Physical Exam  Constitutional: She is oriented to person, place, and time. She appears well-developed and well-nourished.  Cardiovascular: Normal rate, normal heart sounds and intact distal pulses.  No murmur heard. Pulmonary/Chest: Effort normal and breath sounds normal. She has no wheezes. She has no rales. She exhibits no tenderness.  Abdominal: Soft. Bowel sounds are normal. She exhibits no distension and no mass. There is no tenderness.  Musculoskeletal: Normal range of motion.  Neurological: She is alert and oriented to person, place, and time.  Skin:  Left axillary abscess     Assessment & Plan:   1. Prediabetes Diet controlled with A1c of 6.3  2. Abscess Improving Iodoform strip removed  and replaced with new iodoform strip, superficial gauze dressing applied Patient tolerated the procedure well. Nurse visit scheduled for repacking  3. Essential hypertension Elevated, likely due to anxiety of the procedure. Continue antihypertensive, low sodium, DASH diet Reassess at PCP visit for the need for regimen change Advised to come in for fasting labs at next visit.   No orders of the defined types were placed in this  encounter.   Follow-up: Return in about 6 days (around 08/03/2017) for nurse visit - packing of abscess.   Hoy Register MD

## 2017-07-28 NOTE — Patient Instructions (Signed)

## 2017-08-03 ENCOUNTER — Ambulatory Visit: Payer: Self-pay | Attending: Nurse Practitioner | Admitting: *Deleted

## 2017-08-03 DIAGNOSIS — L0291 Cutaneous abscess, unspecified: Secondary | ICD-10-CM

## 2017-08-03 NOTE — Progress Notes (Signed)
Pt tolerated dressing change. She  will return in 8 days for to have dressing change.

## 2017-08-11 ENCOUNTER — Ambulatory Visit: Payer: Self-pay | Attending: Family Medicine | Admitting: *Deleted

## 2017-08-11 ENCOUNTER — Ambulatory Visit: Payer: Self-pay

## 2017-08-11 DIAGNOSIS — Z4800 Encounter for change or removal of nonsurgical wound dressing: Secondary | ICD-10-CM | POA: Insufficient documentation

## 2017-08-11 DIAGNOSIS — L0291 Cutaneous abscess, unspecified: Secondary | ICD-10-CM

## 2017-08-11 NOTE — Patient Instructions (Signed)
Apply warm compress as needed before applying new dressing.  Continue to apply gauze as needed if you notice drainage.

## 2017-08-19 NOTE — Progress Notes (Signed)
Pt packing was not in place from since last OV. Pt states she does not recall seeing the packing with dressing changes. Wound has had minimal drainage.  MD aware and directed patient to: -Apply warm compress as needed before applying new dressing. -Continue to apply gauze as needed if drainage is noted.   Pt has an appointment on 08/28/17.

## 2017-08-28 ENCOUNTER — Ambulatory Visit: Payer: Self-pay | Attending: Nurse Practitioner | Admitting: Nurse Practitioner

## 2017-08-28 ENCOUNTER — Encounter: Payer: Self-pay | Admitting: Nurse Practitioner

## 2017-08-28 VITALS — BP 130/80 | HR 73 | Temp 98.6°F | Ht 62.0 in | Wt 191.2 lb

## 2017-08-28 DIAGNOSIS — Z833 Family history of diabetes mellitus: Secondary | ICD-10-CM | POA: Insufficient documentation

## 2017-08-28 DIAGNOSIS — Z823 Family history of stroke: Secondary | ICD-10-CM | POA: Insufficient documentation

## 2017-08-28 DIAGNOSIS — Z0000 Encounter for general adult medical examination without abnormal findings: Secondary | ICD-10-CM

## 2017-08-28 DIAGNOSIS — Z1231 Encounter for screening mammogram for malignant neoplasm of breast: Secondary | ICD-10-CM

## 2017-08-28 DIAGNOSIS — I10 Essential (primary) hypertension: Secondary | ICD-10-CM | POA: Insufficient documentation

## 2017-08-28 DIAGNOSIS — R7303 Prediabetes: Secondary | ICD-10-CM | POA: Insufficient documentation

## 2017-08-28 DIAGNOSIS — Z809 Family history of malignant neoplasm, unspecified: Secondary | ICD-10-CM | POA: Insufficient documentation

## 2017-08-28 DIAGNOSIS — Z0001 Encounter for general adult medical examination with abnormal findings: Secondary | ICD-10-CM | POA: Insufficient documentation

## 2017-08-28 DIAGNOSIS — Z79899 Other long term (current) drug therapy: Secondary | ICD-10-CM | POA: Insufficient documentation

## 2017-08-28 DIAGNOSIS — Z7982 Long term (current) use of aspirin: Secondary | ICD-10-CM | POA: Insufficient documentation

## 2017-08-28 DIAGNOSIS — E782 Mixed hyperlipidemia: Secondary | ICD-10-CM | POA: Insufficient documentation

## 2017-08-28 DIAGNOSIS — Z8249 Family history of ischemic heart disease and other diseases of the circulatory system: Secondary | ICD-10-CM | POA: Insufficient documentation

## 2017-08-28 LAB — GLUCOSE, POCT (MANUAL RESULT ENTRY): POC GLUCOSE: 103 mg/dL — AB (ref 70–99)

## 2017-08-28 LAB — POCT GLYCOSYLATED HEMOGLOBIN (HGB A1C): Hemoglobin A1C: 6 % — AB (ref 4.0–5.6)

## 2017-08-28 MED ORDER — LOSARTAN POTASSIUM-HCTZ 100-25 MG PO TABS: 1.0000 | ORAL_TABLET | Freq: Every day | ORAL | 1 refills | Status: DC

## 2017-08-28 MED ORDER — ATORVASTATIN CALCIUM 20 MG PO TABS: 20.0000 mg | ORAL_TABLET | Freq: Every day | ORAL | 3 refills | Status: DC

## 2017-08-28 MED ORDER — METOPROLOL SUCCINATE ER 50 MG PO TB24: 50.0000 mg | ORAL_TABLET | Freq: Every day | ORAL | 1 refills | Status: DC

## 2017-08-28 MED FILL — LOSARTAN-HCTZ 100-25 MG TAB: 100-25 | 30 days supply | Qty: 30 | Fill #0

## 2017-08-28 MED FILL — METOPROLOL SUCCINATE ER 50: 50 | 30 days supply | Qty: 30 | Fill #0

## 2017-08-28 MED FILL — ?ATORVASTATIN 20 MG TABLET: 20 | 30 days supply | Qty: 30 | Fill #0

## 2017-08-28 NOTE — Patient Instructions (Signed)
Health Maintenance for Postmenopausal Women Menopause is a normal process in which your reproductive ability comes to an end. This process happens gradually over a span of months to years, usually between the ages of 22 and 9. Menopause is complete when you have missed 12 consecutive menstrual periods. It is important to talk with your health care provider about some of the most common conditions that affect postmenopausal women, such as heart disease, cancer, and bone loss (osteoporosis). Adopting a healthy lifestyle and getting preventive care can help to promote your health and wellness. Those actions can also lower your chances of developing some of these common conditions. What should I know about menopause? During menopause, you may experience a number of symptoms, such as:  Moderate-to-severe hot flashes.  Night sweats.  Decrease in sex drive.  Mood swings.  Headaches.  Tiredness.  Irritability.  Memory problems.  Insomnia.  Choosing to treat or not to treat menopausal changes is an individual decision that you make with your health care provider. What should I know about hormone replacement therapy and supplements? Hormone therapy products are effective for treating symptoms that are associated with menopause, such as hot flashes and night sweats. Hormone replacement carries certain risks, especially as you become older. If you are thinking about using estrogen or estrogen with progestin treatments, discuss the benefits and risks with your health care provider. What should I know about heart disease and stroke? Heart disease, heart attack, and stroke become more likely as you age. This may be due, in part, to the hormonal changes that your body experiences during menopause. These can affect how your body processes dietary fats, triglycerides, and cholesterol. Heart attack and stroke are both medical emergencies. There are many things that you can do to help prevent heart disease  and stroke:  Have your blood pressure checked at least every 1-2 years. High blood pressure causes heart disease and increases the risk of stroke.  If you are 53-22 years old, ask your health care provider if you should take aspirin to prevent a heart attack or a stroke.  Do not use any tobacco products, including cigarettes, chewing tobacco, or electronic cigarettes. If you need help quitting, ask your health care provider.  It is important to eat a healthy diet and maintain a healthy weight. ? Be sure to include plenty of vegetables, fruits, low-fat dairy products, and lean protein. ? Avoid eating foods that are high in solid fats, added sugars, or salt (sodium).  Get regular exercise. This is one of the most important things that you can do for your health. ? Try to exercise for at least 150 minutes each week. The type of exercise that you do should increase your heart rate and make you sweat. This is known as moderate-intensity exercise. ? Try to do strengthening exercises at least twice each week. Do these in addition to the moderate-intensity exercise.  Know your numbers.Ask your health care provider to check your cholesterol and your blood glucose. Continue to have your blood tested as directed by your health care provider.  What should I know about cancer screening? There are several types of cancer. Take the following steps to reduce your risk and to catch any cancer development as early as possible. Breast Cancer  Practice breast self-awareness. ? This means understanding how your breasts normally appear and feel. ? It also means doing regular breast self-exams. Let your health care provider know about any changes, no matter how small.  If you are 40  or older, have a clinician do a breast exam (clinical breast exam or CBE) every year. Depending on your age, family history, and medical history, it may be recommended that you also have a yearly breast X-ray (mammogram).  If you  have a family history of breast cancer, talk with your health care provider about genetic screening.  If you are at high risk for breast cancer, talk with your health care provider about having an MRI and a mammogram every year.  Breast cancer (BRCA) gene test is recommended for women who have family members with BRCA-related cancers. Results of the assessment will determine the need for genetic counseling and BRCA1 and for BRCA2 testing. BRCA-related cancers include these types: ? Breast. This occurs in males or females. ? Ovarian. ? Tubal. This may also be called fallopian tube cancer. ? Cancer of the abdominal or pelvic lining (peritoneal cancer). ? Prostate. ? Pancreatic.  Cervical, Uterine, and Ovarian Cancer Your health care provider may recommend that you be screened regularly for cancer of the pelvic organs. These include your ovaries, uterus, and vagina. This screening involves a pelvic exam, which includes checking for microscopic changes to the surface of your cervix (Pap test).  For women ages 21-65, health care providers may recommend a pelvic exam and a Pap test every three years. For women ages 79-65, they may recommend the Pap test and pelvic exam, combined with testing for human papilloma virus (HPV), every five years. Some types of HPV increase your risk of cervical cancer. Testing for HPV may also be done on women of any age who have unclear Pap test results.  Other health care providers may not recommend any screening for nonpregnant women who are considered low risk for pelvic cancer and have no symptoms. Ask your health care provider if a screening pelvic exam is right for you.  If you have had past treatment for cervical cancer or a condition that could lead to cancer, you need Pap tests and screening for cancer for at least 20 years after your treatment. If Pap tests have been discontinued for you, your risk factors (such as having a new sexual partner) need to be  reassessed to determine if you should start having screenings again. Some women have medical problems that increase the chance of getting cervical cancer. In these cases, your health care provider may recommend that you have screening and Pap tests more often.  If you have a family history of uterine cancer or ovarian cancer, talk with your health care provider about genetic screening.  If you have vaginal bleeding after reaching menopause, tell your health care provider.  There are currently no reliable tests available to screen for ovarian cancer.  Lung Cancer Lung cancer screening is recommended for adults 69-62 years old who are at high risk for lung cancer because of a history of smoking. A yearly low-dose CT scan of the lungs is recommended if you:  Currently smoke.  Have a history of at least 30 pack-years of smoking and you currently smoke or have quit within the past 15 years. A pack-year is smoking an average of one pack of cigarettes per day for one year.  Yearly screening should:  Continue until it has been 15 years since you quit.  Stop if you develop a health problem that would prevent you from having lung cancer treatment.  Colorectal Cancer  This type of cancer can be detected and can often be prevented.  Routine colorectal cancer screening usually begins at  age 42 and continues through age 45.  If you have risk factors for colon cancer, your health care provider may recommend that you be screened at an earlier age.  If you have a family history of colorectal cancer, talk with your health care provider about genetic screening.  Your health care provider may also recommend using home test kits to check for hidden blood in your stool.  A small camera at the end of a tube can be used to examine your colon directly (sigmoidoscopy or colonoscopy). This is done to check for the earliest forms of colorectal cancer.  Direct examination of the colon should be repeated every  5-10 years until age 71. However, if early forms of precancerous polyps or small growths are found or if you have a family history or genetic risk for colorectal cancer, you may need to be screened more often.  Skin Cancer  Check your skin from head to toe regularly.  Monitor any moles. Be sure to tell your health care provider: ? About any new moles or changes in moles, especially if there is a change in a mole's shape or color. ? If you have a mole that is larger than the size of a pencil eraser.  If any of your family members has a history of skin cancer, especially at a young age, talk with your health care provider about genetic screening.  Always use sunscreen. Apply sunscreen liberally and repeatedly throughout the day.  Whenever you are outside, protect yourself by wearing long sleeves, pants, a wide-brimmed hat, and sunglasses.  What should I know about osteoporosis? Osteoporosis is a condition in which bone destruction happens more quickly than new bone creation. After menopause, you may be at an increased risk for osteoporosis. To help prevent osteoporosis or the bone fractures that can happen because of osteoporosis, the following is recommended:  If you are 46-71 years old, get at least 1,000 mg of calcium and at least 600 mg of vitamin D per day.  If you are older than age 55 but younger than age 65, get at least 1,200 mg of calcium and at least 600 mg of vitamin D per day.  If you are older than age 54, get at least 1,200 mg of calcium and at least 800 mg of vitamin D per day.  Smoking and excessive alcohol intake increase the risk of osteoporosis. Eat foods that are rich in calcium and vitamin D, and do weight-bearing exercises several times each week as directed by your health care provider. What should I know about how menopause affects my mental health? Depression may occur at any age, but it is more common as you become older. Common symptoms of depression  include:  Low or sad mood.  Changes in sleep patterns.  Changes in appetite or eating patterns.  Feeling an overall lack of motivation or enjoyment of activities that you previously enjoyed.  Frequent crying spells.  Talk with your health care provider if you think that you are experiencing depression. What should I know about immunizations? It is important that you get and maintain your immunizations. These include:  Tetanus, diphtheria, and pertussis (Tdap) booster vaccine.  Influenza every year before the flu season begins.  Pneumonia vaccine.  Shingles vaccine.  Your health care provider may also recommend other immunizations. This information is not intended to replace advice given to you by your health care provider. Make sure you discuss any questions you have with your health care provider. Document Released: 04/25/2005  Document Revised: 09/21/2015 Document Reviewed: 12/05/2014 [employer] Interactive Patient Education  2018 [employer]  

## 2017-08-28 NOTE — Progress Notes (Signed)
Assessment & Plan:  Paula Alexander was seen today for annual exam.  Diagnoses and all orders for this visit:  Encounter for annual physical exam  Prediabetes -     HgB A1c -     Glucose (CBG) Continue blood sugar control as discussed in office today, low carbohydrate diet, and regular physical exercise as tolerated, 150 minutes per week (30 min each day, 5 days per week, or 50 min 3 days per week).  Annual eye exams and foot exams are recommended.   Essential hypertension -     losartan-hydrochlorothiazide (HYZAAR) 100-25 MG tablet; Take 1 tablet by mouth daily. -     metoprolol succinate (TOPROL-XL) 50 MG 24 hr tablet; Take 1 tablet (50 mg total) by mouth daily. Take with or immediately following a meal. Continue all antihypertensives as prescribed.  Remember to bring in your blood pressure log with you for your follow up appointment.  DASH/Mediterranean Diets are healthier choices for HTN.   Mixed hyperlipidemia -     atorvastatin (LIPITOR) 20 MG tablet; Take 1 tablet (20 mg total) by mouth daily. INSTRUCTIONS: Work on a low fat, heart healthy diet and participate in regular aerobic exercise program by working out at least 150 minutes per week. No fried foods. No junk foods, sodas, sugary drinks, unhealthy snacking, alcohol or smoking.    Breast cancer screening by mammogram -     MM 3D SCREEN BREAST BILATERAL; Future  Patient has been counseled on age-appropriate routine health concerns for screening and prevention. These are reviewed and up-to-date. Referrals have been placed accordingly. Immunizations are up-to-date or declined.    Subjective:   Chief Complaint  Patient presents with  . Annual Exam    Pt. is here for a physical and follow-up.    HPI Paula Alexander 62 y.o. female presents to office today for annual physical and follow up on HTN, Prediabetes and HPL. Her PAP is not due until 2020.   Essential Hypertension Chronic. Well controlled today. She endorses  medication compliance taking Toprol XL 50 mg daily and Hyzaar 100-25 mg daily. Denies chest pain, shortness of breath, palpitations, lightheadedness, dizziness, headaches or BLE edema.  BP Readings from Last 3 Encounters:  08/28/17 130/80  07/28/17 (!) 150/80  07/25/17 125/77    Hyperlipidemia Patient presents for follow up to hyperlipidemia.  She is medication compliant taking lipitor 20mg  daily. She is not diet compliant and denies skin xanthelasma or statin intolerance including myalgias.  Lab Results  Component Value Date   CHOL 194 06/24/2016   Lab Results  Component Value Date   HDL 53 06/24/2016   Lab Results  Component Value Date   LDLCALC 120 (H) 06/24/2016   Lab Results  Component Value Date   TRIG 106 06/24/2016   Lab Results  Component Value Date   CHOLHDL 3.7 06/24/2016       Review of Systems  Constitutional: Negative.  Negative for chills, fever, malaise/fatigue and weight loss.  HENT: Negative.  Negative for congestion, hearing loss, nosebleeds, sinus pain and sore throat.   Eyes: Negative.  Negative for blurred vision, double vision, photophobia and pain.  Respiratory: Negative.  Negative for cough, sputum production, shortness of breath and wheezing.   Cardiovascular: Negative.  Negative for chest pain, palpitations and leg swelling.  Gastrointestinal: Negative.  Negative for abdominal pain, constipation, diarrhea, heartburn, nausea and vomiting.  Genitourinary: Negative.   Musculoskeletal: Negative.  Negative for joint pain and myalgias.  Skin: Negative.  Negative for rash.  Neurological: Negative.  Negative for dizziness, tremors, speech change, focal weakness, seizures and headaches.  Endo/Heme/Allergies: Negative.  Negative for environmental allergies.  Psychiatric/Behavioral: Negative.  Negative for depression and suicidal ideas. The patient is not nervous/anxious and does not have insomnia.     Past Medical History:  Diagnosis Date  . Calcium  deficiency   . Heart murmur   . Hyperlipidemia   . Hypertension     No past surgical history on file.  Family History  Problem Relation Age of Onset  . Cancer Mother   . Hypertension Mother   . Stroke Mother   . Cancer Sister   . Multiple sclerosis Sister   . Diabetes Son     Social History Reviewed with no changes to be made today.   Outpatient Medications Prior to Visit  Medication Sig Dispense Refill  . aspirin EC 81 MG tablet Take 81 mg by mouth daily.    . Vitamin D, Ergocalciferol, (DRISDOL) 50000 units CAPS capsule Take 1 capsule (50,000 Units total) by mouth every 7 (seven) days. For 12 weeks 12 capsule 1  . atorvastatin (LIPITOR) 20 MG tablet Take 1 tablet (20 mg total) by mouth daily. 90 tablet 3  . clindamycin (CLEOCIN) 300 MG capsule Take 1 capsule (300 mg total) by mouth 3 (three) times daily. 30 capsule 0  . losartan-hydrochlorothiazide (HYZAAR) 100-25 MG tablet Take 1 tablet by mouth daily. 90 tablet 1  . metoprolol succinate (TOPROL-XL) 50 MG 24 hr tablet Take 1 tablet (50 mg total) by mouth daily. Take with or immediately following a meal. 90 tablet 1   No facility-administered medications prior to visit.     No Known Allergies     Objective:    BP 130/80 (BP Location: Left Arm, Patient Position: Sitting, Cuff Size: Normal)   Pulse 73   Temp 98.6 F (37 C) (Oral)   Ht 5\' 2"  (1.575 m)   Wt 191 lb 3.2 oz (86.7 kg)   SpO2 100%   BMI 34.97 kg/m  Wt Readings from Last 3 Encounters:  08/28/17 191 lb 3.2 oz (86.7 kg)  07/28/17 192 lb 9.6 oz (87.4 kg)  02/27/17 196 lb 12.8 oz (89.3 kg)    Physical Exam  Constitutional: She is oriented to person, place, and time. She appears well-developed and well-nourished.  HENT:  Head: Normocephalic and atraumatic.  Right Ear: External ear normal.  Left Ear: External ear normal.  Nose: Nose normal.  Mouth/Throat: Oropharynx is clear and moist. No oropharyngeal exudate.  Eyes: Pupils are equal, round, and  reactive to light. Conjunctivae and EOM are normal. Right eye exhibits no discharge. No scleral icterus.  Neck: Normal range of motion. Neck supple. No tracheal deviation present. No thyromegaly present.  Cardiovascular: Normal rate, regular rhythm and intact distal pulses. Exam reveals no friction rub.  Murmur heard. Pulmonary/Chest: Effort normal and breath sounds normal. No accessory muscle usage. No respiratory distress. She has no decreased breath sounds. She has no wheezes. She has no rhonchi. She has no rales. She exhibits no tenderness. Right breast exhibits no inverted nipple, no mass, no nipple discharge, no skin change and no tenderness. Left breast exhibits no inverted nipple, no mass, no nipple discharge, no skin change and no tenderness. No breast swelling, tenderness, discharge or bleeding. Breasts are symmetrical.    Abdominal: Soft. Bowel sounds are normal. She exhibits no distension and no mass. There is no tenderness. There is no rebound and no guarding.  Musculoskeletal: Normal range of motion.  She exhibits no edema, tenderness or deformity.  Lymphadenopathy:    She has no cervical adenopathy.  Neurological: She is alert and oriented to person, place, and time. She has normal reflexes. No cranial nerve deficit. Coordination normal.  Skin: Skin is warm and dry. No erythema.  Psychiatric: She has a normal mood and affect. Her speech is normal and behavior is normal. Judgment and thought content normal.       Patient has been counseled extensively about nutrition and exercise as well as the importance of adherence with medications and regular follow-up. The patient was given clear instructions to go to ER or return to medical center if symptoms don't improve, worsen or new problems develop. The patient verbalized understanding.   Follow-up: Return in about 6 months (around 02/27/2018) for HTN/HPL/prediabetes.   Claiborne RiggZelda W Neveah Bang, FNP-BC [facility]  [address] [place], [place] [phone]   08/28/2017, 1:27 PM

## 2017-09-03 ENCOUNTER — Telehealth: Payer: Self-pay | Admitting: Nurse Practitioner

## 2017-09-03 NOTE — Telephone Encounter (Signed)
Will route to PCP 

## 2017-09-03 NOTE — Telephone Encounter (Signed)
Patient came in and said that she thought you where going to put her on Vitamin D, Ergocalciferol, (DRISDOL) 50000 units CAPS capsule [[id number]][[id number]] . Please call the patient back at [phone]

## 2017-09-07 NOTE — Telephone Encounter (Signed)
She can take 1000 units of vitamin D over the counter.

## 2017-09-08 NOTE — Telephone Encounter (Signed)
CMA spoke to patient to inform PCP advising.   Patient understood.

## 2017-09-30 MED FILL — METOPROLOL SUCCINATE ER 50: 50 | 30 days supply | Qty: 30 | Fill #1

## 2017-09-30 MED FILL — ATORVASTATIN 20 MG TABLET: 20 | 30 days supply | Qty: 30 | Fill #1

## 2017-09-30 MED FILL — LOSARTAN-HCTZ 100-25 MG TAB: 100-25 | 30 days supply | Qty: 30 | Fill #1

## 2017-10-30 MED FILL — ATORVASTATIN 20 MG TABLET: 20 | 30 days supply | Qty: 30 | Fill #2

## 2017-10-30 MED FILL — LOSARTAN-HCTZ 100-25 MG TAB: 100-25 | 30 days supply | Qty: 30 | Fill #2

## 2017-10-30 MED FILL — METOPROLOL SUCCINATE ER 50: 50 | 30 days supply | Qty: 30 | Fill #2

## 2017-12-02 MED FILL — ATORVASTATIN 20 MG TABLET: 20 | 30 days supply | Qty: 30 | Fill #3

## 2017-12-02 MED FILL — METOPROLOL SUCCINATE ER 50: 50 | 30 days supply | Qty: 30 | Fill #3

## 2017-12-02 MED FILL — LOSARTAN-HCTZ 100-25 MG TAB: 100-25 | 30 days supply | Qty: 30 | Fill #3

## 2018-01-04 MED FILL — LOSARTAN-HCTZ 100-25 MG TAB: 100-25 | 30 days supply | Qty: 30 | Fill #4

## 2018-01-04 MED FILL — ATORVASTATIN 20 MG TABLET: 20 | 30 days supply | Qty: 30 | Fill #4

## 2018-01-04 MED FILL — METOPROLOL SUCCINATE ER 50: 50 | 30 days supply | Qty: 30 | Fill #4

## 2018-02-02 MED FILL — METOPROLOL SUCCINATE ER 50: 50 | 30 days supply | Qty: 30 | Fill #5

## 2018-02-02 MED FILL — ATORVASTATIN 20 MG TABLET: 20 | 30 days supply | Qty: 30 | Fill #5

## 2018-02-02 MED FILL — LOSARTAN-HCTZ 100-25 MG TAB: 100-25 | 30 days supply | Qty: 30 | Fill #5

## 2018-03-03 ENCOUNTER — Other Ambulatory Visit: Payer: Self-pay | Admitting: Nurse Practitioner

## 2018-03-03 DIAGNOSIS — I10 Essential (primary) hypertension: Secondary | ICD-10-CM

## 2018-03-03 MED FILL — ATORVASTATIN 20 MG TABLET: 20 | 30 days supply | Qty: 30 | Fill #6

## 2018-03-05 MED FILL — LOSARTAN-HCTZ 100-25 MG TAB: 100-25 | 30 days supply | Qty: 30 | Fill #0

## 2018-03-05 MED FILL — METOPROLOL SUCCINATE ER 50: 50 | 30 days supply | Qty: 30 | Fill #0

## 2018-04-05 ENCOUNTER — Other Ambulatory Visit: Payer: Self-pay | Admitting: Nurse Practitioner

## 2018-04-05 DIAGNOSIS — I10 Essential (primary) hypertension: Secondary | ICD-10-CM

## 2018-04-05 MED FILL — ATORVASTATIN 20 MG TABLET: 20 | 30 days supply | Qty: 30 | Fill #7

## 2018-04-07 ENCOUNTER — Other Ambulatory Visit: Payer: Self-pay | Admitting: Nurse Practitioner

## 2018-04-07 DIAGNOSIS — I10 Essential (primary) hypertension: Secondary | ICD-10-CM

## 2018-04-08 ENCOUNTER — Other Ambulatory Visit: Payer: Self-pay | Admitting: Nurse Practitioner

## 2018-04-08 DIAGNOSIS — I10 Essential (primary) hypertension: Secondary | ICD-10-CM

## 2018-04-08 NOTE — Telephone Encounter (Signed)
Pt states it is urgent that she receives her blood pressure medication losartan-hydrochlorothiazide (HYZAAR) 100-25 MG tablet_

## 2018-04-08 NOTE — Telephone Encounter (Signed)
Pt came in states she has been trying to get her medication (losartan-hydrochlorothiazide (HYZAAR) 100-25 MG tablet_ but the pharmacy told her she needed an appt. Appt has been made for 05/12/18 at 8:30 and she would like to have a refill until her appt Call back # [phone]

## 2018-04-09 ENCOUNTER — Other Ambulatory Visit: Payer: Self-pay | Admitting: Nurse Practitioner

## 2018-04-09 DIAGNOSIS — I10 Essential (primary) hypertension: Secondary | ICD-10-CM

## 2018-04-09 MED ORDER — LOSARTAN POTASSIUM-HCTZ 100-25 MG PO TABS: 1.0000 | ORAL_TABLET | Freq: Every day | ORAL | 0 refills | Status: DC

## 2018-04-09 MED ORDER — METOPROLOL SUCCINATE ER 50 MG PO TB24: 50.0000 mg | ORAL_TABLET | Freq: Every day | ORAL | 0 refills | Status: DC

## 2018-04-09 MED FILL — LOSARTAN-HCTZ 100-25 MG TAB: 100-25 | 30 days supply | Qty: 30 | Fill #0

## 2018-04-09 MED FILL — METOPROLOL SUCCINATE ER 50: 50 | 30 days supply | Qty: 30 | Fill #0

## 2018-05-12 ENCOUNTER — Encounter: Payer: Self-pay | Admitting: Nurse Practitioner

## 2018-05-12 ENCOUNTER — Ambulatory Visit: Payer: Self-pay | Attending: Nurse Practitioner | Admitting: Nurse Practitioner

## 2018-05-12 VITALS — BP 137/85 | HR 81 | Temp 98.4°F | Ht 62.0 in | Wt 197.2 lb

## 2018-05-12 DIAGNOSIS — I10 Essential (primary) hypertension: Secondary | ICD-10-CM

## 2018-05-12 DIAGNOSIS — E559 Vitamin D deficiency, unspecified: Secondary | ICD-10-CM

## 2018-05-12 DIAGNOSIS — E782 Mixed hyperlipidemia: Secondary | ICD-10-CM

## 2018-05-12 DIAGNOSIS — R7303 Prediabetes: Secondary | ICD-10-CM

## 2018-05-12 LAB — POCT GLYCOSYLATED HEMOGLOBIN (HGB A1C): HEMOGLOBIN A1C: 6.1 % — AB (ref 4.0–5.6)

## 2018-05-12 LAB — GLUCOSE, POCT (MANUAL RESULT ENTRY): POC GLUCOSE: 118 mg/dL — AB (ref 70–99)

## 2018-05-12 MED ORDER — LOSARTAN POTASSIUM-HCTZ 100-25 MG PO TABS: 1.0000 | ORAL_TABLET | Freq: Every day | ORAL | 1 refills | Status: DC

## 2018-05-12 MED ORDER — ATORVASTATIN CALCIUM 20 MG PO TABS: 20.0000 mg | ORAL_TABLET | Freq: Every day | ORAL | 3 refills | Status: DC

## 2018-05-12 MED ORDER — METOPROLOL SUCCINATE ER 50 MG PO TB24: 50.0000 mg | ORAL_TABLET | Freq: Every day | ORAL | 1 refills | Status: DC

## 2018-05-12 MED FILL — LOSARTAN-HCTZ 100-25 MG TAB: 100-25 | 30 days supply | Qty: 30 | Fill #0

## 2018-05-12 MED FILL — METOPROLOL SUCCINATE ER 50: 50 | 30 days supply | Qty: 30 | Fill #0

## 2018-05-12 MED FILL — ATORVASTATIN 20 MG TABLET: 20 | 30 days supply | Qty: 30 | Fill #0

## 2018-05-12 NOTE — Patient Instructions (Signed)
Health Maintenance for Postmenopausal Women Menopause is a normal process in which your reproductive ability comes to an end. This process happens gradually over a span of months to years, usually between the ages of 62 and 89. Menopause is complete when you have missed 12 consecutive menstrual periods. It is important to talk with your health care provider about some of the most common conditions that affect postmenopausal women, such as heart disease, cancer, and bone loss (osteoporosis). Adopting a healthy lifestyle and getting preventive care can help to promote your health and wellness. Those actions can also lower your chances of developing some of these common conditions. What should I know about menopause? During menopause, you may experience a number of symptoms, such as:  Moderate-to-severe hot flashes.  Night sweats.  Decrease in sex drive.  Mood swings.  Headaches.  Tiredness.  Irritability.  Memory problems.  Insomnia. Choosing to treat or not to treat menopausal changes is an individual decision that you make with your health care provider. What should I know about hormone replacement therapy and supplements? Hormone therapy products are effective for treating symptoms that are associated with menopause, such as hot flashes and night sweats. Hormone replacement carries certain risks, especially as you become older. If you are thinking about using estrogen or estrogen with progestin treatments, discuss the benefits and risks with your health care provider. What should I know about heart disease and stroke? Heart disease, heart attack, and stroke become more likely as you age. This may be due, in part, to the hormonal changes that your body experiences during menopause. These can affect how your body processes dietary fats, triglycerides, and cholesterol. Heart attack and stroke are both medical emergencies. There are many things that you can do to help prevent heart disease  and stroke:  Have your blood pressure checked at least every 1-2 years. High blood pressure causes heart disease and increases the risk of stroke.  If you are 79-72 years old, ask your health care provider if you should take aspirin to prevent a heart attack or a stroke.  Do not use any tobacco products, including cigarettes, chewing tobacco, or electronic cigarettes. If you need help quitting, ask your health care provider.  It is important to eat a healthy diet and maintain a healthy weight. ? Be sure to include plenty of vegetables, fruits, low-fat dairy products, and lean protein. ? Avoid eating foods that are high in solid fats, added sugars, or salt (sodium).  Get regular exercise. This is one of the most important things that you can do for your health. ? Try to exercise for at least 150 minutes each week. The type of exercise that you do should increase your heart rate and make you sweat. This is known as moderate-intensity exercise. ? Try to do strengthening exercises at least twice each week. Do these in addition to the moderate-intensity exercise.  Know your numbers.Ask your health care provider to check your cholesterol and your blood glucose. Continue to have your blood tested as directed by your health care provider.  What should I know about cancer screening? There are several types of cancer. Take the following steps to reduce your risk and to catch any cancer development as early as possible. Breast Cancer  Practice breast self-awareness. ? This means understanding how your breasts normally appear and feel. ? It also means doing regular breast self-exams. Let your health care provider know about any changes, no matter how small.  If you are 40 or  older, have a clinician do a breast exam (clinical breast exam or CBE) every year. Depending on your age, family history, and medical history, it may be recommended that you also have a yearly breast X-ray (mammogram).  If you  have a family history of breast cancer, talk with your health care provider about genetic screening.  If you are at high risk for breast cancer, talk with your health care provider about having an MRI and a mammogram every year.  Breast cancer (BRCA) gene test is recommended for women who have family members with BRCA-related cancers. Results of the assessment will determine the need for genetic counseling and BRCA1 and for BRCA2 testing. BRCA-related cancers include these types: ? Breast. This occurs in males or females. ? Ovarian. ? Tubal. This may also be called fallopian tube cancer. ? Cancer of the abdominal or pelvic lining (peritoneal cancer). ? Prostate. ? Pancreatic. Cervical, Uterine, and Ovarian Cancer Your health care provider may recommend that you be screened regularly for cancer of the pelvic organs. These include your ovaries, uterus, and vagina. This screening involves a pelvic exam, which includes checking for microscopic changes to the surface of your cervix (Pap test).  For women ages 21-65, health care providers may recommend a pelvic exam and a Pap test every three years. For women ages 39-65, they may recommend the Pap test and pelvic exam, combined with testing for human papilloma virus (HPV), every five years. Some types of HPV increase your risk of cervical cancer. Testing for HPV may also be done on women of any age who have unclear Pap test results.  Other health care providers may not recommend any screening for nonpregnant women who are considered low risk for pelvic cancer and have no symptoms. Ask your health care provider if a screening pelvic exam is right for you.  If you have had past treatment for cervical cancer or a condition that could lead to cancer, you need Pap tests and screening for cancer for at least 20 years after your treatment. If Pap tests have been discontinued for you, your risk factors (such as having a new sexual partner) need to be reassessed  to determine if you should start having screenings again. Some women have medical problems that increase the chance of getting cervical cancer. In these cases, your health care provider may recommend that you have screening and Pap tests more often.  If you have a family history of uterine cancer or ovarian cancer, talk with your health care provider about genetic screening.  If you have vaginal bleeding after reaching menopause, tell your health care provider.  There are currently no reliable tests available to screen for ovarian cancer. Lung Cancer Lung cancer screening is recommended for adults 57-50 years old who are at high risk for lung cancer because of a history of smoking. A yearly low-dose CT scan of the lungs is recommended if you:  Currently smoke.  Have a history of at least 30 pack-years of smoking and you currently smoke or have quit within the past 15 years. A pack-year is smoking an average of one pack of cigarettes per day for one year. Yearly screening should:  Continue until it has been 15 years since you quit.  Stop if you develop a health problem that would prevent you from having lung cancer treatment. Colorectal Cancer  This type of cancer can be detected and can often be prevented.  Routine colorectal cancer screening usually begins at age 12 and continues through  age 75.  If you have risk factors for colon cancer, your health care provider may recommend that you be screened at an earlier age.  If you have a family history of colorectal cancer, talk with your health care provider about genetic screening.  Your health care provider may also recommend using home test kits to check for hidden blood in your stool.  A small camera at the end of a tube can be used to examine your colon directly (sigmoidoscopy or colonoscopy). This is done to check for the earliest forms of colorectal cancer.  Direct examination of the colon should be repeated every 5-10 years until  age 75. However, if early forms of precancerous polyps or small growths are found or if you have a family history or genetic risk for colorectal cancer, you may need to be screened more often. Skin Cancer  Check your skin from head to toe regularly.  Monitor any moles. Be sure to tell your health care provider: ? About any new moles or changes in moles, especially if there is a change in a mole's shape or color. ? If you have a mole that is larger than the size of a pencil eraser.  If any of your family members has a history of skin cancer, especially at a young age, talk with your health care provider about genetic screening.  Always use sunscreen. Apply sunscreen liberally and repeatedly throughout the day.  Whenever you are outside, protect yourself by wearing long sleeves, pants, a wide-brimmed hat, and sunglasses. What should I know about osteoporosis? Osteoporosis is a condition in which bone destruction happens more quickly than new bone creation. After menopause, you may be at an increased risk for osteoporosis. To help prevent osteoporosis or the bone fractures that can happen because of osteoporosis, the following is recommended:  If you are 19-50 years old, get at least 1,000 mg of calcium and at least 600 mg of vitamin D per day.  If you are older than age 50 but younger than age 70, get at least 1,200 mg of calcium and at least 600 mg of vitamin D per day.  If you are older than age 70, get at least 1,200 mg of calcium and at least 800 mg of vitamin D per day. Smoking and excessive alcohol intake increase the risk of osteoporosis. Eat foods that are rich in calcium and vitamin D, and do weight-bearing exercises several times each week as directed by your health care provider. What should I know about how menopause affects my mental health? Depression may occur at any age, but it is more common as you become older. Common symptoms of depression include:  Low or sad  mood.  Changes in sleep patterns.  Changes in appetite or eating patterns.  Feeling an overall lack of motivation or enjoyment of activities that you previously enjoyed.  Frequent crying spells. Talk with your health care provider if you think that you are experiencing depression. What should I know about immunizations? It is important that you get and maintain your immunizations. These include:  Tetanus, diphtheria, and pertussis (Tdap) booster vaccine.  Influenza every year before the flu season begins.  Pneumonia vaccine.  Shingles vaccine. Your health care provider may also recommend other immunizations. This information is not intended to replace advice given to you by your health care provider. Make sure you discuss any questions you have with your health care provider. Document Released: 04/25/2005 Document Revised: 09/21/2015 Document Reviewed: 12/05/2014 Elsevier Interactive Patient Education    2019 [place].

## 2018-05-12 NOTE — Progress Notes (Signed)
Assessment & Plan:  Paula Alexander was seen today for medication refill and sore throat.  Diagnoses and all orders for this visit:  Essential hypertension -     CBC -     CMP14+EGFR -     losartan-hydrochlorothiazide (HYZAAR) 100-25 MG tablet; Take 1 tablet by mouth daily. -     metoprolol succinate (TOPROL-XL) 50 MG 24 hr tablet; Take 1 tablet (50 mg total) by mouth daily. Take with or immediately following a meal. Continue all antihypertensives as prescribed.  Remember to bring in your blood pressure log with you for your follow up appointment.  DASH/Mediterranean Diets are healthier choices for HTN.   Mixed hyperlipidemia -     Lipid panel -     atorvastatin (LIPITOR) 20 MG tablet; Take 1 tablet (20 mg total) by mouth daily. INSTRUCTIONS: Work on a low fat, heart healthy diet and participate in regular aerobic exercise program by working out at least 150 minutes per week; 5 days a week-30 minutes per day. Avoid red meat, fried foods. junk foods, sodas, sugary drinks, unhealthy snacking, alcohol and smoking.  Drink at least 48oz of water per day and monitor your carbohydrate intake daily.   Prediabetes -     Glucose (CBG) -     HgB A1c Continue blood sugar control as discussed in office today, low carbohydrate diet, and regular physical exercise as tolerated, 150 minutes per week (30 min each day, 5 days per week, or 50 min 3 days per week). Keep blood sugar logs with fasting goal of 90-130 mg/dl, post prandial (after you eat) less than 180.  For Hypoglycemia: BS <60 and Hyperglycemia BS >400; contact the clinic ASAP. Annual eye exams and foot exams are recommended.  Vitamin D deficiency -     VITAMIN D 25 Hydroxy (Vit-D Deficiency, Fractures)    Patient has been counseled on age-appropriate routine health concerns for screening and prevention. These are reviewed and up-to-date. Referrals have been placed accordingly. Immunizations are up-to-date or declined.    Subjective:   Chief  Complaint  Patient presents with  . Medication Refill    Pt. need refill on on BP and cholesterol medication.  . Sore Throat    Pt. stated she think she got the flu a while back and her throat feels like it have mucus.    HPI Paula Alexander 63 y.o. female presents to office today for HTN, Prediabetes and HPL. Thinks she "caught the flu" a few weeks ago. Now still with sore throat and voice changes. She had lost her voice for several days last week. Now improving.   Essential Hypertension Currently well controlled. Taking Hyzaar 100-25 mg daily and toprol xl 50 mg daily. She does not monitor her blood pressure at home regularly. She currently denies chest pain, shortness of breath, palpitations, lightheadedness, dizziness, headaches or BLE edema.  BP Readings from Last 3 Encounters:  05/12/18 137/85  08/28/17 130/80  07/28/17 (!) 150/80    Hyperlipidemia Patient presents for follow up to hyperlipidemia.  She is medication compliant taking atorvastatin '20mg'$  daily. She is not diet compliant and denies dyspnea or statin intolerance including myalgias.  Lab Results  Component Value Date   CHOL 194 06/24/2016   Lab Results  Component Value Date   HDL 53 06/24/2016   Lab Results  Component Value Date   LDLCALC 120 (H) 06/24/2016   Lab Results  Component Value Date   TRIG 106 06/24/2016   Lab Results  Component Value Date  CHOLHDL 3.7 06/24/2016    Prediabetes Currently diet controlled. She denies any hypo or hyperglycemic symptoms.  Lab Results  Component Value Date   HGBA1C 6.1 (A) 05/12/2018   Lab Results  Component Value Date   HGBA1C 6.0 (A) 08/28/2017   Review of Systems  Constitutional: Negative for fever, malaise/fatigue and weight loss.  HENT: Positive for sore throat. Negative for congestion and nosebleeds.   Eyes: Negative.  Negative for blurred vision, double vision and photophobia.  Respiratory: Negative.  Negative for cough and shortness of breath.     Cardiovascular: Negative.  Negative for chest pain, palpitations and leg swelling.  Gastrointestinal: Negative.  Negative for heartburn, nausea and vomiting.  Musculoskeletal: Negative.  Negative for myalgias.  Neurological: Negative.  Negative for dizziness, focal weakness, seizures and headaches.  Psychiatric/Behavioral: Negative.  Negative for suicidal ideas.    Past Medical History:  Diagnosis Date  . Calcium deficiency   . Heart murmur   . Hyperlipidemia   . Hypertension     History reviewed. No pertinent surgical history.  Family History  Problem Relation Age of Onset  . Cancer Mother   . Hypertension Mother   . Stroke Mother   . Cancer Sister   . Multiple sclerosis Sister   . Diabetes Son     Social History Reviewed with no changes to be made today.   Outpatient Medications Prior to Visit  Medication Sig Dispense Refill  . aspirin EC 81 MG tablet Take 81 mg by mouth daily.    Marland Kitchen atorvastatin (LIPITOR) 20 MG tablet Take 1 tablet (20 mg total) by mouth daily. 90 tablet 3  . losartan-hydrochlorothiazide (HYZAAR) 100-25 MG tablet Take 1 tablet by mouth daily. MUST MAKE APPT FOR FURTHER REFILLS 30 tablet 0  . metoprolol succinate (TOPROL-XL) 50 MG 24 hr tablet Take 1 tablet (50 mg total) by mouth daily. Take with or immediately following a meal. MUST MAKE APPT FOR FURTHER REFILLS 30 tablet 0  . Vitamin D, Ergocalciferol, (DRISDOL) 50000 units CAPS capsule Take 1 capsule (50,000 Units total) by mouth every 7 (seven) days. For 12 weeks (Patient not taking: Reported on 05/12/2018) 12 capsule 1   No facility-administered medications prior to visit.     No Known Allergies     Objective:    BP 137/85 (BP Location: Left Arm, Patient Position: Sitting, Cuff Size: Large)   Pulse 81   Temp 98.4 F (36.9 C) (Oral)   Ht '5\' 2"'$  (1.575 m)   Wt 197 lb 3.2 oz (89.4 kg)   SpO2 95%   BMI 36.07 kg/m  Wt Readings from Last 3 Encounters:  05/12/18 197 lb 3.2 oz (89.4 kg)  08/28/17  191 lb 3.2 oz (86.7 kg)  07/28/17 192 lb 9.6 oz (87.4 kg)    Physical Exam Vitals signs and nursing note reviewed.  Constitutional:      Appearance: She is well-developed.  HENT:     Head: Normocephalic and atraumatic.     Mouth/Throat:     Mouth: Mucous membranes are moist.     Pharynx: No pharyngeal swelling, oropharyngeal exudate, posterior oropharyngeal erythema or uvula swelling.     Tonsils: No tonsillar exudate or tonsillar abscesses.  Neck:     Musculoskeletal: Normal range of motion.  Cardiovascular:     Rate and Rhythm: Normal rate and regular rhythm.     Heart sounds: Normal heart sounds. No murmur. No friction rub. No gallop.   Pulmonary:     Effort: Pulmonary effort is  normal. No tachypnea or respiratory distress.     Breath sounds: Normal breath sounds. No decreased breath sounds, wheezing, rhonchi or rales.  Chest:     Chest wall: No tenderness.  Abdominal:     General: Bowel sounds are normal.     Palpations: Abdomen is soft.  Musculoskeletal: Normal range of motion.  Skin:    General: Skin is warm and dry.  Neurological:     Mental Status: She is alert and oriented to person, place, and time.     Coordination: Coordination normal.  Psychiatric:        Behavior: Behavior normal. Behavior is cooperative.        Thought Content: Thought content normal.        Judgment: Judgment normal.          Patient has been counseled extensively about nutrition and exercise as well as the importance of adherence with medications and regular follow-up. The patient was given clear instructions to go to ER or return to medical center if symptoms don't improve, worsen or new problems develop. The patient verbalized understanding.   Follow-up: Return for PAP SMEAR.   Gildardo Pounds, FNP-BC [facility] [place] [facility] [place]   05/12/2018, 9:21 AM

## 2018-05-13 LAB — CBC
Hematocrit: 31.7 % — ABNORMAL LOW (ref 34.0–46.6)
Hemoglobin: 10.4 g/dL — ABNORMAL LOW (ref 11.1–15.9)
MCH: 28.2 pg (ref 26.6–33.0)
MCHC: 32.8 g/dL (ref 31.5–35.7)
MCV: 86 fL (ref 79–97)
PLATELETS: 240 10*3/uL (ref 150–450)
RBC: 3.69 x10E6/uL — ABNORMAL LOW (ref 3.77–5.28)
RDW: 12.9 % (ref 11.7–15.4)
WBC: 6.5 10*3/uL (ref 3.4–10.8)

## 2018-05-13 LAB — CMP14+EGFR
A/G RATIO: 1.6 (ref 1.2–2.2)
ALT: 30 IU/L (ref 0–32)
AST: 31 IU/L (ref 0–40)
Albumin: 3.9 g/dL (ref 3.8–4.8)
Alkaline Phosphatase: 85 IU/L (ref 39–117)
BUN/Creatinine Ratio: 16 (ref 12–28)
BUN: 15 mg/dL (ref 8–27)
Bilirubin Total: <0.2 mg/dL (ref 0.0–1.2)
CHLORIDE: 104 mmol/L (ref 96–106)
CO2: 23 mmol/L (ref 20–29)
Calcium: 9.2 mg/dL (ref 8.7–10.3)
Creatinine, Ser: 0.94 mg/dL (ref 0.57–1.00)
GFR calc Af Amer: 75 mL/min/{1.73_m2} (ref >59)
GFR calc non Af Amer: 65 mL/min/{1.73_m2} (ref >59)
GLUCOSE: 100 mg/dL — AB (ref 65–99)
Globulin, Total: 2.4 g/dL (ref 1.5–4.5)
POTASSIUM: 3.7 mmol/L (ref 3.5–5.2)
Sodium: 142 mmol/L (ref 134–144)
Total Protein: 6.3 g/dL (ref 6.0–8.5)

## 2018-05-13 LAB — LIPID PANEL
CHOLESTEROL TOTAL: 140 mg/dL (ref 100–199)
Chol/HDL Ratio: 2.7 ratio (ref 0.0–4.4)
HDL: 52 mg/dL (ref >39)
LDL Calculated: 75 mg/dL (ref 0–99)
TRIGLYCERIDES: 64 mg/dL (ref 0–149)
VLDL Cholesterol Cal: 13 mg/dL (ref 5–40)

## 2018-05-13 LAB — VITAMIN D 25 HYDROXY (VIT D DEFICIENCY, FRACTURES): Vit D, 25-Hydroxy: 20.2 ng/mL — ABNORMAL LOW (ref 30.0–100.0)

## 2018-05-15 ENCOUNTER — Other Ambulatory Visit: Payer: Self-pay | Admitting: Nurse Practitioner

## 2018-05-15 DIAGNOSIS — E559 Vitamin D deficiency, unspecified: Secondary | ICD-10-CM

## 2018-05-15 DIAGNOSIS — D649 Anemia, unspecified: Secondary | ICD-10-CM

## 2018-05-15 MED ORDER — VITAMIN D (ERGOCALCIFEROL) 1.25 MG (50000 UNIT) PO CAPS: 50000.0000 [IU] | ORAL_CAPSULE | ORAL | 1 refills | Status: DC

## 2018-05-19 ENCOUNTER — Telehealth: Payer: Self-pay

## 2018-05-19 NOTE — Telephone Encounter (Signed)
CMA attempt to reach patient to inform on results.  No answer and unable to leave a VM due to no option.  CMA left a VM on patient's emergency contact to have patient to call back for results.

## 2018-05-20 ENCOUNTER — Encounter: Payer: Self-pay | Admitting: Gastroenterology

## 2018-05-20 NOTE — Telephone Encounter (Signed)
Patient wants lab results

## 2018-05-20 NOTE — Telephone Encounter (Signed)
Patient contacted via phone to be given results of labs.  Patient identified by name and date of birth.  Patient given results of labs.  Patient educated on lab results. Questions answered. Patient acknowledged understanding of labs results. Patient advised of Practitoners note.  Patient given number to Gastroenterology in case they didn't call her for an appointment.

## 2018-05-27 MED FILL — VIT D2 1.25 MG (50,000 UNIT: 1.25 MG | 28 days supply | Qty: 4 | Fill #0

## 2018-06-01 ENCOUNTER — Ambulatory Visit (INDEPENDENT_AMBULATORY_CARE_PROVIDER_SITE_OTHER): Payer: Self-pay | Admitting: Gastroenterology

## 2018-06-01 ENCOUNTER — Other Ambulatory Visit (INDEPENDENT_AMBULATORY_CARE_PROVIDER_SITE_OTHER): Payer: Self-pay

## 2018-06-01 ENCOUNTER — Other Ambulatory Visit: Payer: Self-pay

## 2018-06-01 ENCOUNTER — Encounter: Payer: Self-pay | Admitting: Gastroenterology

## 2018-06-01 VITALS — BP 112/64 | HR 68 | Temp 96.9°F | Ht 62.0 in | Wt 192.1 lb

## 2018-06-01 DIAGNOSIS — Z87898 Personal history of other specified conditions: Secondary | ICD-10-CM

## 2018-06-01 DIAGNOSIS — D649 Anemia, unspecified: Secondary | ICD-10-CM

## 2018-06-01 DIAGNOSIS — Z8719 Personal history of other diseases of the digestive system: Secondary | ICD-10-CM

## 2018-06-01 DIAGNOSIS — Z1211 Encounter for screening for malignant neoplasm of colon: Secondary | ICD-10-CM

## 2018-06-01 LAB — HEPATIC FUNCTION PANEL
ALK PHOS: 87 U/L (ref 39–117)
ALT: 22 U/L (ref 0–35)
AST: 25 U/L (ref 0–37)
Albumin: 4 g/dL (ref 3.5–5.2)
BILIRUBIN DIRECT: 0 mg/dL (ref 0.0–0.3)
TOTAL PROTEIN: 7.6 g/dL (ref 6.0–8.3)
Total Bilirubin: 0.3 mg/dL (ref 0.2–1.2)

## 2018-06-01 LAB — CBC
HCT: 35.5 % — ABNORMAL LOW (ref 36.0–46.0)
HEMOGLOBIN: 11.7 g/dL — AB (ref 12.0–15.0)
MCHC: 32.8 g/dL (ref 30.0–36.0)
MCV: 88 fl (ref 78.0–100.0)
PLATELETS: 246 10*3/uL (ref 150.0–400.0)
RBC: 4.04 Mil/uL (ref 3.87–5.11)
RDW: 14.2 % (ref 11.5–15.5)
WBC: 7.5 10*3/uL (ref 4.0–10.5)

## 2018-06-01 LAB — IGA: IGA: 339 mg/dL (ref 68–378)

## 2018-06-01 MED ORDER — PEG 3350-KCL-NA BICARB-NACL 420 G PO SOLR: 4000.0000 mL | Freq: Once | ORAL | 0 refills | Status: AC

## 2018-06-01 NOTE — Patient Instructions (Signed)
If you are age 63 or older, your body mass index should be between 23-30. Your Body mass index is 35.14 kg/m. If this is out of the aforementioned range listed, please consider follow up with your Primary Care Provider.  If you are age 81 or younger, your body mass index should be between 19-25. Your Body mass index is 35.14 kg/m. If this is out of the aformentioned range listed, please consider follow up with your Primary Care Provider.   Your provider has requested that you go to the basement level for lab work before leaving today. Press "B" on the elevator. The lab is located at the first door on the left as you exit the elevator.  You have been scheduled for a colonoscopy. Please follow written instructions given to you at your visit today.  Please pick up your prep supplies at the pharmacy within the next 1-3 days. If you use inhalers (even only as needed), please bring them with you on the day of your procedure. Your physician has requested that you go to www.startemmi.com and enter the access code given to you at your visit today. This web site gives a general overview about your procedure. However, you should still follow specific instructions given to you by our office regarding your preparation for the procedure.  Thank you for choosing me and [facility].  Dr. Meridee Score

## 2018-06-01 NOTE — Progress Notes (Signed)
GASTROENTEROLOGY OUTPATIENT CLINIC VISIT   Primary Care Provider Claiborne Rigg, NP [address] [place] [address] [phone]  Referring Provider Claiborne Rigg, NP [address] [place], [place] [address] [phone]  Patient Profile: Paula Alexander is a 63 y.o. female with a pmh significant for hypertension, hyperlipidemia, obesity, peripheral vascular disease, fibromyalgia.  The patient presents to the [facility] for an evaluation and management of problem(s) noted below:  Problem List 1. Anemia, unspecified type   2. Colon cancer screening   3. History of odynophagia   4. History of gastroesophageal reflux (GERD)     History of Present Illness: This is the patient's first visit to the outpatient [facility].  The patient is referred for further evaluation of her underlying normocytic anemia which she has had longstanding for years.  She describes a prior history years ago of having menorrhagia and very significant menses.  However she is not had menses in years since going through the change.  She has been on iron on and off over the course of her life.  Is not currently on any at this time.  She has not been feeling herself for the last month to month and a half as she was diagnosed with the flu recently and got over that after being treated with Tamiflu but is still trying to regain her strength.  She denies any history of prior GI bleeding in the form of melena or hematochezia.  She has a prior history of odynophagia and dysphagia for a period in time that was treated with likely a PPI but she cannot recall the name of this medication and has not been on any PPIs for years as she no longer has any acid reflux or dysphagia or odynophagia.  She did not have any imaging studies of her esophagus including an esophagram in the past.  The patient has no family history of GI malignancies and no hematologic disorders in her family that she is  aware of.  She does not take any nonsteroidals other than a baby aspirin.  She has never had an upper or lower endoscopy.  GI Review of Systems Positive as above Negative for regurgitation, abdominal pain, nausea, vomiting, jaundice, change in appetite, early satiety, melena, change in bowel habits  Review of Systems General: Denies fevers/chills/weight loss HEENT: Denies oral lesions Cardiovascular: Denies chest pain Pulmonary: Denies shortness of breath/nocturnal cough Gastroenterological: See HPI Genitourinary: Denies darkened urine or hematuria Hematological: Denies easy bruising/bleeding Dermatological: Denies jaundice Psychological: Mood is stable   Medications Current Outpatient Medications  Medication Sig Dispense Refill   aspirin EC 81 MG tablet Take 81 mg by mouth daily.     atorvastatin (LIPITOR) 20 MG tablet Take 1 tablet (20 mg total) by mouth daily. 90 tablet 3   losartan-hydrochlorothiazide (HYZAAR) 100-25 MG tablet Take 1 tablet by mouth daily. 90 tablet 1   metoprolol succinate (TOPROL-XL) 50 MG 24 hr tablet Take 1 tablet (50 mg total) by mouth daily. Take with or immediately following a meal. 90 tablet 1   Vitamin D, Ergocalciferol, (DRISDOL) 1.25 MG (50000 UT) CAPS capsule Take 1 capsule (50,000 Units total) by mouth every 7 (seven) days. For 12 weeks 12 capsule 1   No current facility-administered medications for this visit.     Allergies No Known Allergies  Histories Past Medical History:  Diagnosis Date   Calcium deficiency    Heart murmur    Hyperlipidemia    Hypertension  History reviewed. No pertinent surgical history. Social History   Socioeconomic History   Marital status: Legally Separated    Spouse name: Not on file   Number of children: Not on file   Years of education: Not on file   Highest education level: Not on file  Occupational History   Not on file  Social Needs   Financial resource strain: Not on file   Food  insecurity:    Worry: Not on file    Inability: Not on file   Transportation needs:    Medical: Not on file    Non-medical: Not on file  Tobacco Use   Smoking status: Never Smoker   Smokeless tobacco: Never Used  Substance and Sexual Activity   Alcohol use: No   Drug use: No   Sexual activity: Not on file  Lifestyle   Physical activity:    Days per week: Not on file    Minutes per session: Not on file   Stress: Not on file  Relationships   Social connections:    Talks on phone: Not on file    Gets together: Not on file    Attends religious service: Not on file    Active member of club or organization: Not on file    Attends meetings of clubs or organizations: Not on file    Relationship status: Not on file   Intimate partner violence:    Fear of current or ex partner: Not on file    Emotionally abused: Not on file    Physically abused: Not on file    Forced sexual activity: Not on file  Other Topics Concern   Not on file  Social History Narrative   Not on file   Family History  Problem Relation Age of Onset   Hypertension Mother    Stroke Mother    Breast cancer Mother    Multiple sclerosis Sister    Breast cancer Sister    Diabetes Son    Colon cancer Neg Hx    Esophageal cancer Neg Hx    Inflammatory bowel disease Neg Hx    Liver disease Neg Hx    Pancreatic cancer Neg Hx    Rectal cancer Neg Hx    Stomach cancer Neg Hx    I have reviewed her medical, social, and family history in detail and updated the electronic medical record as necessary.    PHYSICAL EXAMINATION  BP 112/64 (BP Location: Left Arm, Patient Position: Sitting, Cuff Size: Normal)    Pulse 68    Temp (!) 96.9 F (36.1 C)    Ht 5\' 2"  (1.575 m)    Wt 192 lb 2 oz (87.1 kg)    BMI 35.14 kg/m  Wt Readings from Last 3 Encounters:  06/01/18 192 lb 2 oz (87.1 kg)  05/12/18 197 lb 3.2 oz (89.4 kg)  08/28/17 191 lb 3.2 oz (86.7 kg)  GEN: NAD, appears stated age, doesn't  appear chronically ill PSYCH: Cooperative, without pressured speech EYE: Conjunctivae pink, sclerae anicteric ENT: MMM, without oral ulcers, no erythema or exudates noted NECK: Supple, enlarged neck girth CV: RR without R/Gs  RESP: CTAB posteriorly, without wheezing GI: NABS, soft, rounded, very mild ventral diastases noted on sit up flexion of abdomen, nontender, without rebound or guarding, no HSM appreciated MSK/EXT: No lower extremity edema SKIN: No jaundice NEURO:  Alert & Oriented x 3, no focal deficits   REVIEW OF DATA  I reviewed the following data at the time of  this encounter:  GI Procedures and Studies  No relevant studies to review  Laboratory Studies  Reviewed in epic  Imaging Studies  No relevant studies to review   ASSESSMENT  Ms. Tubby is a 63 y.o. female with a pmh significant for hypertension, hyperlipidemia, obesity, peripheral vascular disease, fibromyalgia.  The patient is seen today for evaluation and management of:  1. Anemia, unspecified type   2. Colon cancer screening   3. History of odynophagia   4. History of gastroesophageal reflux (GERD)    The patient is hemodynamically stable and has a longstanding history of normocytic anemia.  She is not had iron indices or vitamin deficiencies evaluated for recently normal plan to proceed with this.  She is due for colon cancer screening as she is never had a colonoscopy with blood testing or FIT testing.  I think she will benefit from at least a colonoscopy to get her up-to-date for that.  If she shows any evidence of iron deficiency or iron insufficiency then it would be worthwhile even though having no current upper GI symptoms to evaluate the upper GI tract and also initiate her on iron supplementation.  I talked with the patient anemia can come to many different forms and the issue that is not GI related but rather require additional work-up from our hematology colleagues but we will have to see.  The risks  and benefits of endoscopic evaluation were discussed with the patient; these include but are not limited to the risk of perforation, infection, bleeding, missed lesions, lack of diagnosis, severe illness requiring hospitalization, as well as anesthesia and sedation related illnesses.  The patient is agreeable to proceed with a colonoscopy and upper endoscopy if it is required based on the labs.  All patient questions were answered, to the best of my ability, and the patient agrees to the aforementioned plan of action with follow-up as indicated.   PLAN  Laboratories as outlined below Proceed with scheduling a screening colonoscopy plus minus diagnostic endoscopy based on the patient's laboratories Hematology referral may be considered if no findings on our laboratory work-up as well as diagnostic work-up   Orders Placed This Encounter  Procedures   CBC   Hepatic function panel   IBC + Ferritin   Reticulocytes   Haptoglobin   B12   Folate   IgA   Tissue transglutaminase, IgA   Ambulatory referral to Gastroenterology    New Prescriptions   No medications on file   Modified Medications   No medications on file    Planned Follow Up: No follow-ups on file.   Corliss Parish, MD [facility] Advanced Endoscopy Office # [phone]

## 2018-06-02 LAB — HAPTOGLOBIN: Haptoglobin: 320 mg/dL — ABNORMAL HIGH (ref 43–212)

## 2018-06-02 LAB — TISSUE TRANSGLUTAMINASE, IGA: (tTG) Ab, IgA: 1 U/mL

## 2018-06-02 LAB — IBC + FERRITIN
Ferritin: 49.3 ng/mL (ref 10.0–291.0)
Iron: 47 ug/dL (ref 42–145)
SATURATION RATIOS: 13.4 % — AB (ref 20.0–50.0)
Transferrin: 251 mg/dL (ref 212.0–360.0)

## 2018-06-02 LAB — FOLATE: FOLATE: 11.2 ng/mL (ref >5.9)

## 2018-06-02 LAB — VITAMIN B12: VITAMIN B 12: 1057 pg/mL — AB (ref 211–911)

## 2018-06-02 LAB — RETICULOCYTES
ABS RETIC: 44550 {cells}/uL (ref 20000–8000)
RETIC CT PCT: 1.1 %

## 2018-06-02 MED FILL — LOSARTAN-HCTZ 100-25 MG TAB: 100-25 | 60 days supply | Qty: 60 | Fill #1

## 2018-06-02 MED FILL — METOPROLOL SUCCINATE ER 50: 50 | 60 days supply | Qty: 60 | Fill #1

## 2018-06-02 MED FILL — GOLYTELY SOLUTION: 236 | 1 days supply | Qty: 4000 | Fill #0

## 2018-06-02 MED FILL — ATORVASTATIN 20 MG TABLET: 20 | 60 days supply | Qty: 60 | Fill #1

## 2018-06-02 MED FILL — VIT D2 1.25 MG (50,000 UNIT: 1.25 MG | 56 days supply | Qty: 8 | Fill #1

## 2018-06-04 ENCOUNTER — Encounter: Payer: Self-pay | Admitting: Gastroenterology

## 2018-06-04 DIAGNOSIS — Z8719 Personal history of other diseases of the digestive system: Secondary | ICD-10-CM | POA: Insufficient documentation

## 2018-06-04 DIAGNOSIS — Z87898 Personal history of other specified conditions: Secondary | ICD-10-CM | POA: Insufficient documentation

## 2018-06-04 DIAGNOSIS — Z1211 Encounter for screening for malignant neoplasm of colon: Secondary | ICD-10-CM | POA: Insufficient documentation

## 2018-06-04 DIAGNOSIS — D649 Anemia, unspecified: Secondary | ICD-10-CM | POA: Insufficient documentation

## 2018-06-09 ENCOUNTER — Other Ambulatory Visit: Payer: Self-pay

## 2018-06-09 MED ORDER — FERROUS GLUCONATE 324 (38 FE) MG PO TABS: 324.0000 mg | ORAL_TABLET | Freq: Every day | ORAL | 2 refills | Status: DC

## 2018-07-02 ENCOUNTER — Encounter: Payer: Self-pay | Admitting: Gastroenterology

## 2018-07-22 ENCOUNTER — Telehealth: Payer: Self-pay | Admitting: *Deleted

## 2018-07-22 NOTE — Telephone Encounter (Signed)
Ok to schedule procedure per Dr. Meridee Score.  Attempted to reach pt; unable to speak with her or leave a message

## 2018-07-26 NOTE — Telephone Encounter (Signed)
Pt will call back on her lunch break to schedule procedure.  I told her I would mail her new instructions after I see when she schedules her appointment.

## 2018-08-11 ENCOUNTER — Other Ambulatory Visit: Payer: Self-pay | Admitting: Nurse Practitioner

## 2018-08-11 DIAGNOSIS — E782 Mixed hyperlipidemia: Secondary | ICD-10-CM

## 2018-08-11 DIAGNOSIS — I10 Essential (primary) hypertension: Secondary | ICD-10-CM

## 2018-08-11 MED ORDER — METOPROLOL SUCCINATE ER 50 MG PO TB24: 50.0000 mg | ORAL_TABLET | Freq: Every day | ORAL | 1 refills | Status: DC

## 2018-08-11 MED ORDER — ATORVASTATIN CALCIUM 20 MG PO TABS: 20.0000 mg | ORAL_TABLET | Freq: Every day | ORAL | 3 refills | Status: DC

## 2018-08-11 MED ORDER — LOSARTAN POTASSIUM-HCTZ 100-25 MG PO TABS: 1.0000 | ORAL_TABLET | Freq: Every day | ORAL | 1 refills | Status: DC

## 2018-08-11 MED FILL — ATORVASTATIN 20 MG TABLET: 20 | 60 days supply | Qty: 60 | Fill #2

## 2018-08-11 MED FILL — LOSARTAN-HCTZ 100-25 MG TAB: 100-25 | 60 days supply | Qty: 60 | Fill #2

## 2018-08-11 MED FILL — METOPROLOL SUCCINATE ER 50: 50 | 60 days supply | Qty: 60 | Fill #2

## 2018-08-11 MED FILL — VIT D2 1.25 MG (50,000 UNIT: 1.25 MG | 56 days supply | Qty: 8 | Fill #2

## 2018-10-11 MED FILL — VIT D2 1.25 MG (50,000 UNIT: 1.25 MG | 27 days supply | Qty: 4 | Fill #3

## 2018-10-11 MED FILL — ATORVASTATIN 20 MG TABLET: 20 | 60 days supply | Qty: 60 | Fill #3

## 2018-10-11 MED FILL — LOSARTAN-HCTZ 100-25 MG TAB: 100-25 | 30 days supply | Qty: 30 | Fill #3

## 2018-10-11 MED FILL — METOPROLOL SUCCINATE ER 50: 50 | 30 days supply | Qty: 30 | Fill #3

## 2018-11-09 ENCOUNTER — Other Ambulatory Visit: Payer: Self-pay | Admitting: Nurse Practitioner

## 2018-11-09 DIAGNOSIS — I10 Essential (primary) hypertension: Secondary | ICD-10-CM

## 2018-11-09 DIAGNOSIS — E559 Vitamin D deficiency, unspecified: Secondary | ICD-10-CM

## 2018-11-10 ENCOUNTER — Telehealth: Payer: Self-pay | Admitting: Nurse Practitioner

## 2018-11-10 ENCOUNTER — Other Ambulatory Visit: Payer: Self-pay | Admitting: Nurse Practitioner

## 2018-11-10 DIAGNOSIS — I10 Essential (primary) hypertension: Secondary | ICD-10-CM

## 2018-11-10 DIAGNOSIS — E559 Vitamin D deficiency, unspecified: Secondary | ICD-10-CM

## 2018-11-10 DIAGNOSIS — E782 Mixed hyperlipidemia: Secondary | ICD-10-CM

## 2018-11-10 MED ORDER — METOPROLOL SUCCINATE ER 50 MG PO TB24: 50.0000 mg | ORAL_TABLET | Freq: Every day | ORAL | 0 refills | Status: DC

## 2018-11-10 MED ORDER — ATORVASTATIN CALCIUM 20 MG PO TABS: 20.0000 mg | ORAL_TABLET | Freq: Every day | ORAL | 0 refills | Status: DC

## 2018-11-10 MED ORDER — LOSARTAN POTASSIUM-HCTZ 100-25 MG PO TABS: 1.0000 | ORAL_TABLET | Freq: Every day | ORAL | 0 refills | Status: DC

## 2018-11-10 MED ORDER — VITAMIN D (ERGOCALCIFEROL) 1.25 MG (50000 UNIT) PO CAPS: 50000.0000 [IU] | ORAL_CAPSULE | ORAL | 0 refills | Status: DC

## 2018-11-10 MED FILL — VIT D2 1.25 MG (50,000 UNIT: 1.25 MG | 28 days supply | Qty: 4 | Fill #0

## 2018-11-10 NOTE — Telephone Encounter (Signed)
°  Aspirin 81 mg Daily   Atorvastatin Calcium 20 mg Oral Daily   Ergocalciferol 50,000 Units Oral Every 7 days, For 12 weeks   Ferrous Gluconate 324 mg Oral Daily with breakfast   Losartan Potassium-HCTZ 100-25 MG 1 tablet Oral Daily   Metoprolol Succinate 50 mg Oral Daily, Take with or immediately following a meal.   Patient called stating she was told she would not have to worry about med refills. Patient has scheduled an apt for her med refills. Please follow up.

## 2018-11-10 NOTE — Telephone Encounter (Signed)
Refills provided for 1 month. Patient can have additional refills sent by her PCP on her 8/31 appointment.   The iron and aspirin may be obtained OTC.

## 2018-11-15 ENCOUNTER — Encounter: Payer: Self-pay | Admitting: Pharmacist

## 2018-11-15 ENCOUNTER — Other Ambulatory Visit: Payer: Self-pay

## 2018-11-15 ENCOUNTER — Encounter: Payer: Self-pay | Admitting: Nurse Practitioner

## 2018-11-15 ENCOUNTER — Ambulatory Visit: Payer: Self-pay | Attending: Nurse Practitioner | Admitting: Nurse Practitioner

## 2018-11-15 ENCOUNTER — Ambulatory Visit (HOSPITAL_BASED_OUTPATIENT_CLINIC_OR_DEPARTMENT_OTHER): Payer: Self-pay | Admitting: Pharmacist

## 2018-11-15 VITALS — BP 138/82 | HR 93 | Temp 98.8°F | Ht 62.0 in | Wt 199.2 lb

## 2018-11-15 DIAGNOSIS — I10 Essential (primary) hypertension: Secondary | ICD-10-CM

## 2018-11-15 DIAGNOSIS — G8929 Other chronic pain: Secondary | ICD-10-CM

## 2018-11-15 DIAGNOSIS — D649 Anemia, unspecified: Secondary | ICD-10-CM

## 2018-11-15 DIAGNOSIS — E782 Mixed hyperlipidemia: Secondary | ICD-10-CM

## 2018-11-15 DIAGNOSIS — M25561 Pain in right knee: Secondary | ICD-10-CM

## 2018-11-15 DIAGNOSIS — R7303 Prediabetes: Secondary | ICD-10-CM

## 2018-11-15 DIAGNOSIS — Z23 Encounter for immunization: Secondary | ICD-10-CM

## 2018-11-15 LAB — POCT GLYCOSYLATED HEMOGLOBIN (HGB A1C): Hemoglobin A1C: 6.1 % — AB (ref 4.0–5.6)

## 2018-11-15 LAB — GLUCOSE, POCT (MANUAL RESULT ENTRY): POC Glucose: 131 mg/dl — AB (ref 70–99)

## 2018-11-15 MED ORDER — FERROUS GLUCONATE 324 (38 FE) MG PO TABS: 324.0000 mg | ORAL_TABLET | Freq: Every day | ORAL | 2 refills | Status: DC

## 2018-11-15 MED ORDER — ATORVASTATIN CALCIUM 20 MG PO TABS: 20.0000 mg | ORAL_TABLET | Freq: Every day | ORAL | 1 refills | Status: DC

## 2018-11-15 MED ORDER — METOPROLOL SUCCINATE ER 50 MG PO TB24: 50.0000 mg | ORAL_TABLET | Freq: Every day | ORAL | 1 refills | Status: DC

## 2018-11-15 MED ORDER — LOSARTAN POTASSIUM-HCTZ 100-25 MG PO TABS: 1.0000 | ORAL_TABLET | Freq: Every day | ORAL | 1 refills | Status: DC

## 2018-11-15 MED ORDER — DICLOFENAC SODIUM 1 % TD GEL: 2.0000 g | Freq: Four times a day (QID) | TRANSDERMAL | 0 refills | Status: AC

## 2018-11-15 MED FILL — LOSARTAN-HCTZ 100-25 MG TAB: 100-25 | 30 days supply | Qty: 30 | Fill #0

## 2018-11-15 MED FILL — METOPROLOL SUCC ER 50 MG TA: 50 | 30 days supply | Qty: 30 | Fill #0

## 2018-11-15 MED FILL — DICLOFENAC SODIUM 1% GEL: 1 | 12 days supply | Qty: 100 | Fill #0

## 2018-11-15 NOTE — Progress Notes (Signed)
Assessment & Plan:  Paula Alexander was seen today for medication refill.  Diagnoses and all orders for this visit:  Essential hypertension -     CMP14+EGFR -     Discontinue: losartan-hydrochlorothiazide (HYZAAR) 100-25 MG tablet; Take 1 tablet by mouth daily. -     Discontinue: metoprolol succinate (TOPROL-XL) 50 MG 24 hr tablet; Take 1 tablet (50 mg total) by mouth daily. Take with or immediately following a meal. -     losartan-hydrochlorothiazide (HYZAAR) 100-25 MG tablet; Take 1 tablet by mouth daily. -     metoprolol succinate (TOPROL-XL) 50 MG 24 hr tablet; Take 1 tablet (50 mg total) by mouth daily. Take with or immediately following a meal. Continue all antihypertensives as prescribed.  Remember to bring in your blood pressure log with you for your follow up appointment.  DASH/Mediterranean Diets are healthier choices for HTN.    Prediabetes -     Glucose (CBG) -     HgB A1c  Anemia, unspecified type -     CBC -     Iron, TIBC and Ferritin Panel -     ferrous gluconate (FERGON) 324 MG tablet; Take 1 tablet (324 mg total) by mouth daily with breakfast.  Mixed hyperlipidemia -     Discontinue: atorvastatin (LIPITOR) 20 MG tablet; Take 1 tablet (20 mg total) by mouth daily. -     atorvastatin (LIPITOR) 20 MG tablet; Take 1 tablet (20 mg total) by mouth daily. INSTRUCTIONS: Work on a low fat, heart healthy diet and participate in regular aerobic exercise program by working out at least 150 minutes per week; 5 days a week-30 minutes per day. Avoid red meat, fried foods. junk foods, sodas, sugary drinks, unhealthy snacking, alcohol and smoking.  Drink at least 48oz of water per day and monitor your carbohydrate intake daily.  Lab Results  Component Value Date   LDLCALC 75 05/12/2018     Chronic pain of right knee -     diclofenac sodium (VOLTAREN) 1 % GEL; Apply 2 g topically 4 (four) times daily. Work on losing weight to help reduce joint pain. May alternate with heat and ice  application for pain relief. May also alternate with acetaminophen as prescribed pain relief. Other alternatives include massage, acupuncture and water aerobics.  You must stay active and avoid a sedentary lifestyle.    Patient has been counseled on age-appropriate routine health concerns for screening and prevention. These are reviewed and up-to-date. Referrals have been placed accordingly. Immunizations are up-to-date or declined.    Subjective:   Chief Complaint  Patient presents with  . Medication Refill   HPI Paula Alexander 63 y.o. female presents to office today for follow up.  has a past medical history of Anemia, Calcium deficiency, Heart murmur, Hyperlipidemia, Hypertension, and PVD (peripheral vascular disease) ([place]).   She has not been able to reschedule her colonoscopy.   Essential Hypertension Blood pressure is well controlled. She is taking hyzaar 100-25 mg dialy, toprol xl 50 mg daily. She does not monitor her blood pressure at home. Denies chest pain, shortness of breath, palpitations, lightheadedness, dizziness, headaches or BLE edema.  BP Readings from Last 3 Encounters:  11/15/18 138/82  06/01/18 112/64  05/12/18 137/85    Prediabetes Well controlled with diet. She does need to exercise more. Has no formal routine. States she works a lot and finds it hard to find time to workout.  Lab Results  Component Value Date   HGBA1C 6.1 (A) 11/15/2018  Right Knee Pain Fell down 2 steps going down into her office at work a few months ago. States she fell on both knees but more so on her right knee. She was able to bear weight however had significant pain in her right knee. She did not seek any emergent care for her knee and states the pain resolved on its own however she does still experience intermittent sharp and aching pain in the right knee. Denies any swelling or inability to bear weight.    .Review of Systems  Constitutional: Negative for fever,  malaise/fatigue and weight loss.  HENT: Negative.  Negative for nosebleeds.   Eyes: Negative.  Negative for blurred vision, double vision and photophobia.  Respiratory: Negative.  Negative for cough and shortness of breath.   Cardiovascular: Negative.  Negative for chest pain, palpitations and leg swelling.  Gastrointestinal: Negative.  Negative for heartburn, nausea and vomiting.  Musculoskeletal: Positive for joint pain (right knee pain). Negative for myalgias.  Neurological: Negative.  Negative for dizziness, focal weakness, seizures and headaches.  Psychiatric/Behavioral: Negative.  Negative for suicidal ideas.    Past Medical History:  Diagnosis Date  . Anemia   . Calcium deficiency   . Heart murmur   . Hyperlipidemia   . Hypertension   . PVD (peripheral vascular disease) ([place])     History reviewed. No pertinent surgical history.  Family History  Problem Relation Age of Onset  . Hypertension Mother   . Stroke Mother   . Breast cancer Mother   . Multiple sclerosis Sister   . Breast cancer Sister   . Diabetes Son   . Colon cancer Neg Hx   . Esophageal cancer Neg Hx   . Inflammatory bowel disease Neg Hx   . Liver disease Neg Hx   . Pancreatic cancer Neg Hx   . Rectal cancer Neg Hx   . Stomach cancer Neg Hx     Social History Reviewed with no changes to be made today.   Outpatient Medications Prior to Visit  Medication Sig Dispense Refill  . aspirin EC 81 MG tablet Take 81 mg by mouth daily.    . Vitamin D, Ergocalciferol, (DRISDOL) 1.25 MG (50000 UT) CAPS capsule Take 1 capsule (50,000 Units total) by mouth every 7 (seven) days. For 12 weeks 4 capsule 0  . atorvastatin (LIPITOR) 20 MG tablet Take 1 tablet (20 mg total) by mouth daily. 30 tablet 0  . losartan-hydrochlorothiazide (HYZAAR) 100-25 MG tablet Take 1 tablet by mouth daily. 30 tablet 0  . metoprolol succinate (TOPROL-XL) 50 MG 24 hr tablet Take 1 tablet (50 mg total) by mouth daily. Take with or immediately  following a meal. 30 tablet 0  . ferrous gluconate (FERGON) 324 MG tablet Take 1 tablet (324 mg total) by mouth daily with breakfast. (Patient not taking: Reported on 11/15/2018) 30 tablet 2   No facility-administered medications prior to visit.     No Known Allergies     Objective:    BP 138/82 (BP Location: Left Arm, Patient Position: Sitting, Cuff Size: Large)   Pulse 93   Temp 98.8 F (37.1 C) (Oral)   Ht _0  (1.575 m)   Wt 199 lb 3.2 oz (90.4 kg)   SpO2 99%   BMI 36.43 kg/m  Wt Readings from Last 3 Encounters:  11/15/18 199 lb 3.2 oz (90.4 kg)  06/01/18 192 lb 2 oz (87.1 kg)  05/12/18 197 lb 3.2 oz (89.4 kg)    Physical Exam Vitals signs  and nursing note reviewed.  Constitutional:      Appearance: She is well-developed.  HENT:     Head: Normocephalic and atraumatic.  Neck:     Musculoskeletal: Normal range of motion.  Cardiovascular:     Rate and Rhythm: Normal rate and regular rhythm.     Heart sounds: Normal heart sounds. No murmur. No friction rub. No gallop.   Pulmonary:     Effort: Pulmonary effort is normal. No tachypnea or respiratory distress.     Breath sounds: Normal breath sounds. No decreased breath sounds, wheezing, rhonchi or rales.  Chest:     Chest wall: No tenderness.  Abdominal:     General: Bowel sounds are normal.     Palpations: Abdomen is soft.  Musculoskeletal: Normal range of motion.        General: No swelling, tenderness, deformity or signs of injury.     Right lower leg: No edema.     Left lower leg: No edema.  Skin:    General: Skin is warm and dry.  Neurological:     Mental Status: She is alert and oriented to person, place, and time.     Coordination: Coordination normal.  Psychiatric:        Behavior: Behavior normal. Behavior is cooperative.        Thought Content: Thought content normal.        Judgment: Judgment normal.        Patient has been counseled extensively about nutrition and exercise as well as the  importance of adherence with medications and regular follow-up. The patient was given clear instructions to go to ER or return to medical center if symptoms don't improve, worsen or new problems develop. The patient verbalized understanding.   Follow-up: Return for PAP SMEAR.   Gildardo Pounds, FNP-BC [facility] and [place], [place]   11/15/2018, 8:12 PM

## 2018-11-15 NOTE — Progress Notes (Signed)
Patient presents for vaccination against influenza per orders of [name]. Consent given. Counseling provided. No contraindications exists. Vaccine administered without incident.   

## 2018-11-17 LAB — CBC
Hematocrit: 33.5 % — ABNORMAL LOW (ref 34.0–46.6)
Hemoglobin: 10.9 g/dL — ABNORMAL LOW (ref 11.1–15.9)
MCH: 28.5 pg (ref 26.6–33.0)
MCHC: 32.5 g/dL (ref 31.5–35.7)
MCV: 88 fL (ref 79–97)
Platelets: 196 10*3/uL (ref 150–450)
RBC: 3.83 x10E6/uL (ref 3.77–5.28)
RDW: 13.3 % (ref 11.7–15.4)
WBC: 5.9 10*3/uL (ref 3.4–10.8)

## 2018-11-17 LAB — CMP14+EGFR
ALT: 20 IU/L (ref 0–32)
AST: 29 IU/L (ref 0–40)
Albumin/Globulin Ratio: 1.5 (ref 1.2–2.2)
Albumin: 4.2 g/dL (ref 3.8–4.8)
Alkaline Phosphatase: 84 IU/L (ref 39–117)
BUN/Creatinine Ratio: 21 (ref 12–28)
BUN: 20 mg/dL (ref 8–27)
Bilirubin Total: <0.2 mg/dL (ref 0.0–1.2)
CO2: 25 mmol/L (ref 20–29)
Calcium: 9.4 mg/dL (ref 8.7–10.3)
Chloride: 109 mmol/L — ABNORMAL HIGH (ref 96–106)
Creatinine, Ser: 0.94 mg/dL (ref 0.57–1.00)
GFR calc Af Amer: 75 mL/min/{1.73_m2} (ref >59)
GFR calc non Af Amer: 65 mL/min/{1.73_m2} (ref >59)
Globulin, Total: 2.8 g/dL (ref 1.5–4.5)
Glucose: 85 mg/dL (ref 65–99)
Potassium: 4.5 mmol/L (ref 3.5–5.2)
Sodium: 145 mmol/L — ABNORMAL HIGH (ref 134–144)
Total Protein: 7 g/dL (ref 6.0–8.5)

## 2018-11-17 LAB — IRON,TIBC AND FERRITIN PANEL
Ferritin: 73 ng/mL (ref 15–150)
Iron Saturation: 25 % (ref 15–55)
Iron: 78 ug/dL (ref 27–139)
Total Iron Binding Capacity: 315 ug/dL (ref 250–450)
UIBC: 237 ug/dL (ref 118–369)

## 2018-12-13 MED FILL — LOSARTAN-HCTZ 100-25 MG TAB: 100-25 | 30 days supply | Qty: 30 | Fill #1

## 2018-12-13 MED FILL — METOPROLOL SUCC ER 50 MG TA: 50 | 30 days supply | Qty: 30 | Fill #1

## 2018-12-13 MED FILL — ATORVASTATIN CALCIUM 20 MG: 20 | 30 days supply | Qty: 30 | Fill #0

## 2019-01-13 MED FILL — ATORVASTATIN CALCIUM 20 MG: 20 | 60 days supply | Qty: 60 | Fill #1

## 2019-01-13 MED FILL — LOSARTAN-HCTZ 100-25 MG TAB: 100-25 | 60 days supply | Qty: 60 | Fill #2

## 2019-01-13 MED FILL — METOPROLOL SUCCINATE ER 50: 50 | 60 days supply | Qty: 60 | Fill #2

## 2019-03-21 MED FILL — LOSARTAN-HCTZ 100-25 MG TAB: 100-25 | 60 days supply | Qty: 60 | Fill #3

## 2019-03-21 MED FILL — ATORVASTATIN CALCIUM 20 MG: 20 | 60 days supply | Qty: 60 | Fill #2

## 2019-03-21 MED FILL — METOPROLOL SUCCINATE ER 50: 50 | 60 days supply | Qty: 60 | Fill #3

## 2019-04-04 ENCOUNTER — Encounter: Payer: Self-pay | Admitting: Nurse Practitioner

## 2019-04-04 ENCOUNTER — Other Ambulatory Visit: Payer: Self-pay

## 2019-04-04 ENCOUNTER — Ambulatory Visit: Payer: Self-pay | Attending: Nurse Practitioner | Admitting: Nurse Practitioner

## 2019-04-04 VITALS — BP 137/77 | HR 79 | Temp 97.5°F | Ht 62.0 in | Wt 193.0 lb

## 2019-04-04 DIAGNOSIS — E782 Mixed hyperlipidemia: Secondary | ICD-10-CM

## 2019-04-04 DIAGNOSIS — E559 Vitamin D deficiency, unspecified: Secondary | ICD-10-CM

## 2019-04-04 DIAGNOSIS — I10 Essential (primary) hypertension: Secondary | ICD-10-CM

## 2019-04-04 DIAGNOSIS — R7303 Prediabetes: Secondary | ICD-10-CM

## 2019-04-04 DIAGNOSIS — Z1211 Encounter for screening for malignant neoplasm of colon: Secondary | ICD-10-CM

## 2019-04-04 DIAGNOSIS — D649 Anemia, unspecified: Secondary | ICD-10-CM

## 2019-04-04 DIAGNOSIS — Z0000 Encounter for general adult medical examination without abnormal findings: Secondary | ICD-10-CM

## 2019-04-04 LAB — POCT GLYCOSYLATED HEMOGLOBIN (HGB A1C): Hemoglobin A1C: 6.1 % — AB (ref 4.0–5.6)

## 2019-04-04 LAB — GLUCOSE, POCT (MANUAL RESULT ENTRY): POC Glucose: 107 mg/dl — AB (ref 70–99)

## 2019-04-04 MED ORDER — ATORVASTATIN CALCIUM 20 MG PO TABS: 20.0000 mg | ORAL_TABLET | Freq: Every day | ORAL | 1 refills | Status: DC

## 2019-04-04 MED ORDER — LOSARTAN POTASSIUM-HCTZ 100-25 MG PO TABS: 1.0000 | ORAL_TABLET | Freq: Every day | ORAL | 1 refills | Status: DC

## 2019-04-04 MED ORDER — METOPROLOL SUCCINATE ER 50 MG PO TB24: 50.0000 mg | ORAL_TABLET | Freq: Every day | ORAL | 1 refills | Status: DC

## 2019-04-04 MED ORDER — FERROUS GLUCONATE 324 (38 FE) MG PO TABS: 324.0000 mg | ORAL_TABLET | Freq: Every day | ORAL | 2 refills | Status: DC

## 2019-04-04 NOTE — Patient Instructions (Signed)

## 2019-04-04 NOTE — Progress Notes (Signed)
Assessment & Plan:  Paula Alexander was seen today for annual exam.  Diagnoses and all orders for this visit:  Encounter for annual physical exam  Essential hypertension -     losartan-hydrochlorothiazide (HYZAAR) 100-25 MG tablet; Take 1 tablet by mouth daily. -     metoprolol succinate (TOPROL-XL) 50 MG 24 hr tablet; Take 1 tablet (50 mg total) by mouth daily. Take with or immediately following a meal. -     CMP14+EGFR  Mixed hyperlipidemia -     atorvastatin (LIPITOR) 20 MG tablet; Take 1 tablet (20 mg total) by mouth daily. -     Lipid panel  Anemia, unspecified type -     ferrous gluconate (FERGON) 324 MG tablet; Take 1 tablet (324 mg total) by mouth daily with breakfast. -     CBC  Vitamin D deficiency disease -     VITAMIN D 25 Hydroxy (Vit-D Deficiency, Fractures)  Colon cancer screening -     Fecal occult blood, imunochemical([facility])  Prediabetes -     Glucose (CBG) -     HgB A1c    Patient has been counseled on age-appropriate routine health concerns for screening and prevention. These are reviewed and up-to-date. Referrals have been placed accordingly. Immunizations are up-to-date or declined.    Subjective:   Chief Complaint  Patient presents with  . Annual Exam   HPI Paula Alexander 64 y.o. female presents to office today   [place] She plans to fill out the scholarship application for her mammogram She needs follow up with Gastroenterology for anemia and colonoscopy She is not taking any over the counter Vitamin D but she is taking a MVI which contains Vit D. I have instructed   Review of Systems  Constitutional: Negative for fever, malaise/fatigue and weight loss.  HENT: Negative.  Negative for nosebleeds.   Eyes: Negative.  Negative for blurred vision, double vision and photophobia.  Respiratory: Negative.  Negative for cough and shortness of breath.   Cardiovascular: Negative.  Negative for chest pain, palpitations and leg  swelling.  Gastrointestinal: Negative.  Negative for heartburn, nausea and vomiting.  Genitourinary: Negative.   Musculoskeletal: Negative.  Negative for myalgias.  Skin: Negative.   Neurological: Negative.  Negative for dizziness, focal weakness, seizures and headaches.  Endo/Heme/Allergies: Negative.   Psychiatric/Behavioral: Negative.  Negative for suicidal ideas.    Past Medical History:  Diagnosis Date  . Anemia   . Calcium deficiency   . Heart murmur   . Hyperlipidemia   . Hypertension   . PVD (peripheral vascular disease) ([place])     History reviewed. No pertinent surgical history.  Family History  Problem Relation Age of Onset  . Hypertension Mother   . Stroke Mother   . Breast cancer Mother   . Multiple sclerosis Sister   . Breast cancer Sister   . Diabetes Son   . Colon cancer Neg Hx   . Esophageal cancer Neg Hx   . Inflammatory bowel disease Neg Hx   . Liver disease Neg Hx   . Pancreatic cancer Neg Hx   . Rectal cancer Neg Hx   . Stomach cancer Neg Hx     Social History Reviewed with no changes to be made today.   Outpatient Medications Prior to Visit  Medication Sig Dispense Refill  . aspirin EC 81 MG tablet Take 81 mg by mouth daily.    . Vitamin D, Ergocalciferol, (DRISDOL) 1.25 MG (50000 UT) CAPS capsule Take 1 capsule (50,000 Units  total) by mouth every 7 (seven) days. For 12 weeks 4 capsule 0  . atorvastatin (LIPITOR) 20 MG tablet Take 1 tablet (20 mg total) by mouth daily. 90 tablet 1  . ferrous gluconate (FERGON) 324 MG tablet Take 1 tablet (324 mg total) by mouth daily with breakfast. 90 tablet 2  . losartan-hydrochlorothiazide (HYZAAR) 100-25 MG tablet Take 1 tablet by mouth daily. 90 tablet 1  . metoprolol succinate (TOPROL-XL) 50 MG 24 hr tablet Take 1 tablet (50 mg total) by mouth daily. Take with or immediately following a meal. 90 tablet 1   No facility-administered medications prior to visit.    No Known Allergies     Objective:    BP  137/77 (BP Location: Left Arm, Patient Position: Sitting, Cuff Size: Large)   Pulse 79   Temp (!) 97.5 F (36.4 C) (Oral)   Ht '5\' 2"'$  (1.575 m)   Wt 193 lb (87.5 kg)   SpO2 97%   BMI 35.30 kg/m  Wt Readings from Last 3 Encounters:  04/04/19 193 lb (87.5 kg)  11/15/18 199 lb 3.2 oz (90.4 kg)  06/01/18 192 lb 2 oz (87.1 kg)    Physical Exam Constitutional:      Appearance: She is well-developed.  HENT:     Head: Normocephalic and atraumatic.     Right Ear: External ear normal.     Left Ear: External ear normal.     Nose: Nose normal.     Mouth/Throat:     Pharynx: No oropharyngeal exudate.  Eyes:     General: Lids are normal. No scleral icterus.       Right eye: No discharge.     Extraocular Movements: Extraocular movements intact.     Right eye: Normal extraocular motion and no nystagmus.     Left eye: Normal extraocular motion and no nystagmus.     Conjunctiva/sclera: Conjunctivae normal.     Pupils: Pupils are equal, round, and reactive to light.  Neck:     Thyroid: No thyromegaly.     Trachea: No tracheal deviation.  Cardiovascular:     Rate and Rhythm: Normal rate and regular rhythm.     Pulses:          Posterior tibial pulses are 1+ on the right side and 1+ on the left side.     Heart sounds: No murmur. No friction rub.  Pulmonary:     Effort: Pulmonary effort is normal. No accessory muscle usage or respiratory distress.     Breath sounds: Normal breath sounds. No decreased breath sounds, wheezing, rhonchi or rales.  Chest:     Chest wall: No tenderness.     Breasts: Breasts are symmetrical.        Right: Normal. No inverted nipple, mass, nipple discharge, skin change or tenderness.        Left: Normal. No inverted nipple, mass, nipple discharge, skin change or tenderness.    Abdominal:     General: Bowel sounds are normal. There is no distension or abdominal bruit.     Palpations: Abdomen is soft. There is no mass.     Tenderness: There is no abdominal  tenderness. There is no guarding or rebound.  Musculoskeletal:        General: No tenderness or deformity. Normal range of motion.     Cervical back: Normal range of motion and neck supple.  Feet:     Right foot:     Skin integrity: No skin breakdown, erythema, callus or  dry skin.     Left foot:     Skin integrity: No skin breakdown, erythema, callus or dry skin.  Lymphadenopathy:     Cervical: No cervical adenopathy.     Upper Body:     Right upper body: No supraclavicular, axillary or pectoral adenopathy.     Left upper body: No supraclavicular, axillary or pectoral adenopathy.  Skin:    General: Skin is warm and dry.     Findings: No erythema.  Neurological:     Mental Status: She is alert and oriented to person, place, and time.     Cranial Nerves: No cranial nerve deficit.     Sensory: Sensation is intact.     Motor: Motor function is intact.     Coordination: Coordination is intact. Coordination normal.     Gait: Gait is intact.     Deep Tendon Reflexes: Reflexes are normal and symmetric.     Reflex Scores:      Patellar reflexes are 2+ on the right side and 2+ on the left side. Psychiatric:        Speech: Speech normal.        Behavior: Behavior normal.        Thought Content: Thought content normal.        Judgment: Judgment normal.          Patient has been counseled extensively about nutrition and exercise as well as the importance of adherence with medications and regular follow-up. The patient was given clear instructions to go to ER or return to medical center if symptoms don't improve, worsen or new problems develop. The patient verbalized understanding.   Follow-up: Return for PAP SMEAR also need Orange CARD/CAFA app today. Gildardo Pounds, FNP-BC [facility] and [facility] [place], [place]   04/04/2019, 10:48 AM

## 2019-04-05 ENCOUNTER — Other Ambulatory Visit: Payer: Self-pay | Admitting: Nurse Practitioner

## 2019-04-05 DIAGNOSIS — E559 Vitamin D deficiency, unspecified: Secondary | ICD-10-CM

## 2019-04-05 LAB — CMP14+EGFR
ALT: 20 IU/L (ref 0–32)
AST: 25 IU/L (ref 0–40)
Albumin/Globulin Ratio: 1.4 (ref 1.2–2.2)
Albumin: 4.4 g/dL (ref 3.8–4.8)
Alkaline Phosphatase: 100 IU/L (ref 39–117)
BUN/Creatinine Ratio: 18 (ref 12–28)
BUN: 19 mg/dL (ref 8–27)
Bilirubin Total: 0.3 mg/dL (ref 0.0–1.2)
CO2: 22 mmol/L (ref 20–29)
Calcium: 9.9 mg/dL (ref 8.7–10.3)
Chloride: 103 mmol/L (ref 96–106)
Creatinine, Ser: 1.04 mg/dL — ABNORMAL HIGH (ref 0.57–1.00)
GFR calc Af Amer: 66 mL/min/{1.73_m2} (ref >59)
GFR calc non Af Amer: 57 mL/min/{1.73_m2} — ABNORMAL LOW (ref >59)
Globulin, Total: 3.1 g/dL (ref 1.5–4.5)
Glucose: 100 mg/dL — ABNORMAL HIGH (ref 65–99)
Potassium: 3.8 mmol/L (ref 3.5–5.2)
Sodium: 142 mmol/L (ref 134–144)
Total Protein: 7.5 g/dL (ref 6.0–8.5)

## 2019-04-05 LAB — CBC
Hematocrit: 37.7 % (ref 34.0–46.6)
Hemoglobin: 12.6 g/dL (ref 11.1–15.9)
MCH: 29.1 pg (ref 26.6–33.0)
MCHC: 33.4 g/dL (ref 31.5–35.7)
MCV: 87 fL (ref 79–97)
Platelets: 214 10*3/uL (ref 150–450)
RBC: 4.33 x10E6/uL (ref 3.77–5.28)
RDW: 13.1 % (ref 11.7–15.4)
WBC: 6.1 10*3/uL (ref 3.4–10.8)

## 2019-04-05 LAB — LIPID PANEL
Chol/HDL Ratio: 2.7 ratio (ref 0.0–4.4)
Cholesterol, Total: 159 mg/dL (ref 100–199)
HDL: 58 mg/dL (ref >39)
LDL Chol Calc (NIH): 86 mg/dL (ref 0–99)
Triglycerides: 82 mg/dL (ref 0–149)
VLDL Cholesterol Cal: 15 mg/dL (ref 5–40)

## 2019-04-05 LAB — VITAMIN D 25 HYDROXY (VIT D DEFICIENCY, FRACTURES): Vit D, 25-Hydroxy: 23.9 ng/mL — ABNORMAL LOW (ref 30.0–100.0)

## 2019-04-05 MED ORDER — VITAMIN D (ERGOCALCIFEROL) 1.25 MG (50000 UNIT) PO CAPS: 50000.0000 [IU] | ORAL_CAPSULE | ORAL | 0 refills | Status: AC

## 2019-04-06 MED FILL — VIT D2 1.25 MG (50,000 UNIT: 1.25 MG | 84 days supply | Qty: 12 | Fill #0

## 2019-04-21 ENCOUNTER — Ambulatory Visit: Payer: Self-pay | Admitting: Nurse Practitioner

## 2019-04-22 ENCOUNTER — Ambulatory Visit: Payer: Self-pay

## 2019-05-14 ENCOUNTER — Ambulatory Visit: Payer: Self-pay | Attending: Internal Medicine

## 2019-05-14 DIAGNOSIS — Z23 Encounter for immunization: Secondary | ICD-10-CM | POA: Insufficient documentation

## 2019-05-14 NOTE — Progress Notes (Signed)
   Covid-19 Vaccination Clinic  Name:  Paula Alexander    MRN: [id number] DOB: Oct 13, 1955  05/14/2019  Ms. Paula Alexander was observed post Covid-19 immunization for 15 minutes without incidence. She was provided with Vaccine Information Sheet and instruction to access the V-Safe system.   Ms. Paula Alexander was instructed to call 911 with any severe reactions post vaccine: Marland Kitchen Difficulty breathing  . Swelling of your face and throat  . A fast heartbeat  . A bad rash all over your body  . Dizziness and weakness    Immunizations Administered    Name Date Dose VIS Date Route   Pfizer COVID-19 Vaccine 05/14/2019 10:46 AM 0.3 mL 02/25/2019 Intramuscular   Manufacturer: [employer], [employer]   Lot: [id number]   NDC: [address]-0

## 2019-05-17 MED FILL — LOSARTAN-HCTZ 100-25 MG TAB: 100-25 | 30 days supply | Qty: 30 | Fill #0

## 2019-05-17 MED FILL — ATORVASTATIN CALCIUM 20 MG: 20 | 30 days supply | Qty: 30 | Fill #0

## 2019-05-17 MED FILL — VIT D2 1.25 MG (50,000 UNIT: 1.25 MG | 84 days supply | Qty: 12 | Fill #0

## 2019-05-17 MED FILL — METOPROLOL SUCCINATE ER 50: 50 | 30 days supply | Qty: 30 | Fill #0

## 2019-06-07 ENCOUNTER — Encounter: Payer: Self-pay | Admitting: Nurse Practitioner

## 2019-06-07 ENCOUNTER — Ambulatory Visit: Payer: Self-pay | Attending: Nurse Practitioner | Admitting: Nurse Practitioner

## 2019-06-07 ENCOUNTER — Other Ambulatory Visit: Payer: Self-pay

## 2019-06-07 VITALS — BP 122/72 | HR 69 | Temp 97.7°F | Ht 62.0 in | Wt 189.0 lb

## 2019-06-07 DIAGNOSIS — Z124 Encounter for screening for malignant neoplasm of cervix: Secondary | ICD-10-CM

## 2019-06-07 NOTE — Progress Notes (Signed)
Assessment & Plan:  Paula Alexander was seen today for gynecologic exam.  Diagnoses and all orders for this visit:  Encounter for Papanicolaou smear for cervical cancer screening -     Cervicovaginal ancillary only -     Cytology - PAP    Patient has been counseled on age-appropriate routine health concerns for screening and prevention. These are reviewed and up-to-date. Referrals have been placed accordingly. Immunizations are up-to-date or declined.    Subjective:   Chief Complaint  Patient presents with  . Gynecologic Exam    Pt. is here for a pap smear.    HPI Paula Alexander 64 y.o. female presents to office today for PAP   Review of Systems  Constitutional: Negative.  Negative for chills, fever, malaise/fatigue and weight loss.  Respiratory: Negative.  Negative for cough, shortness of breath and wheezing.   Cardiovascular: Negative.  Negative for chest pain, orthopnea and leg swelling.  Gastrointestinal: Negative for abdominal pain.  Genitourinary: Negative.  Negative for flank pain.  Skin: Negative.  Negative for rash.  Psychiatric/Behavioral: Negative for suicidal ideas.    Past Medical History:  Diagnosis Date  . Anemia   . Calcium deficiency   . Heart murmur   . Hyperlipidemia   . Hypertension   . PVD (peripheral vascular disease) ([place])     History reviewed. No pertinent surgical history.  Family History  Problem Relation Age of Onset  . Hypertension Mother   . Stroke Mother   . Breast cancer Mother   . Multiple sclerosis Sister   . Breast cancer Sister   . Diabetes Son   . Colon cancer Neg Hx   . Esophageal cancer Neg Hx   . Inflammatory bowel disease Neg Hx   . Liver disease Neg Hx   . Pancreatic cancer Neg Hx   . Rectal cancer Neg Hx   . Stomach cancer Neg Hx     Social History Reviewed with no changes to be made today.   Outpatient Medications Prior to Visit  Medication Sig Dispense Refill  . aspirin EC 81 MG tablet Take 81 mg by mouth  daily.    Marland Kitchen atorvastatin (LIPITOR) 20 MG tablet Take 1 tablet (20 mg total) by mouth daily. 90 tablet 1  . ferrous gluconate (FERGON) 324 MG tablet Take 1 tablet (324 mg total) by mouth daily with breakfast. 90 tablet 2  . losartan-hydrochlorothiazide (HYZAAR) 100-25 MG tablet Take 1 tablet by mouth daily. 90 tablet 1  . metoprolol succinate (TOPROL-XL) 50 MG 24 hr tablet Take 1 tablet (50 mg total) by mouth daily. Take with or immediately following a meal. 90 tablet 1  . Vitamin D, Ergocalciferol, (DRISDOL) 1.25 MG (50000 UNIT) CAPS capsule Take 1 capsule (50,000 Units total) by mouth every 7 (seven) days. For 12 weeks. Please fill as a 90 day supply 12 capsule 0   No facility-administered medications prior to visit.    No Known Allergies     Objective:    BP 122/72 (BP Location: Left Arm, Patient Position: Sitting, Cuff Size: Large)   Pulse 69   Temp 97.7 F (36.5 C) (Temporal)   Ht 5\' 2"  (1.575 m)   Wt 189 lb (85.7 kg)   SpO2 99%   BMI 34.57 kg/m  Wt Readings from Last 3 Encounters:  06/07/19 189 lb (85.7 kg)  04/04/19 193 lb (87.5 kg)  11/15/18 199 lb 3.2 oz (90.4 kg)    Physical Exam Exam conducted with a chaperone present.  Constitutional:  Appearance: She is well-developed.  HENT:     Head: Normocephalic.  Cardiovascular:     Rate and Rhythm: Normal rate and regular rhythm.     Heart sounds: Normal heart sounds.  Pulmonary:     Effort: Pulmonary effort is normal.     Breath sounds: Normal breath sounds.  Abdominal:     General: Bowel sounds are normal.     Palpations: Abdomen is soft.     Hernia: There is no hernia in the left inguinal area.  Genitourinary:    Exam position: Lithotomy position.     Labia:        Right: No rash, tenderness, lesion or injury.        Left: No rash, tenderness, lesion or injury.      Vagina: Normal. No signs of injury and foreign body. No vaginal discharge, erythema, tenderness or bleeding.     Cervix: Cervical bleeding  (mild bleeding with endocervical sampling) present. No cervical motion tenderness or friability.     Uterus: Not deviated and not enlarged.      Adnexa:        Right: No mass, tenderness or fullness.         Left: No mass, tenderness or fullness.       Rectum: Normal. No external hemorrhoid.  Lymphadenopathy:     Lower Body: No right inguinal adenopathy. No left inguinal adenopathy.  Skin:    General: Skin is warm and dry.  Neurological:     Mental Status: She is alert and oriented to person, place, and time.  Psychiatric:        Behavior: Behavior normal.        Thought Content: Thought content normal.        Judgment: Judgment normal.          Patient has been counseled extensively about nutrition and exercise as well as the importance of adherence with medications and regular follow-up. The patient was given clear instructions to go to ER or return to medical center if symptoms don't improve, worsen or new problems develop. The patient verbalized understanding.   Follow-up: Return in about 3 months (around 09/07/2019).   Claiborne Rigg, FNP-BC [facility] [place], [place] [phone]   06/07/2019, 4:21 PM

## 2019-06-08 LAB — CERVICOVAGINAL ANCILLARY ONLY
Bacterial Vaginitis (gardnerella): NEGATIVE
Candida Glabrata: NEGATIVE
Candida Vaginitis: NEGATIVE
Chlamydia: NEGATIVE
Comment: NEGATIVE
Comment: NEGATIVE
Comment: NEGATIVE
Comment: NEGATIVE
Comment: NEGATIVE
Comment: NORMAL
Neisseria Gonorrhea: NEGATIVE
Trichomonas: NEGATIVE

## 2019-06-08 LAB — FECAL OCCULT BLOOD, IMMUNOCHEMICAL: Fecal Occult Bld: NEGATIVE

## 2019-06-10 LAB — CYTOLOGY - PAP
Comment: NEGATIVE
Diagnosis: NEGATIVE
Diagnosis: REACTIVE
High risk HPV: NEGATIVE

## 2019-06-22 MED FILL — ATORVASTATIN CALCIUM 20 MG: 20 | 60 days supply | Qty: 60 | Fill #1

## 2019-06-22 MED FILL — LOSARTAN-HCTZ 100-25 MG TAB: 100-25 | 60 days supply | Qty: 60 | Fill #1

## 2019-06-22 MED FILL — METOPROLOL SUCCINATE ER 50: 50 | 60 days supply | Qty: 60 | Fill #1

## 2019-08-30 MED FILL — LOSARTAN-HCTZ 100-25 MG TAB: 100-25 | 60 days supply | Qty: 60 | Fill #2

## 2019-08-30 MED FILL — ATORVASTATIN CALCIUM 20 MG: 20 | 60 days supply | Qty: 60 | Fill #2

## 2019-08-30 MED FILL — METOPROLOL SUCCINATE ER 50: 50 | 60 days supply | Qty: 60 | Fill #2

## 2019-10-27 ENCOUNTER — Other Ambulatory Visit: Payer: Self-pay | Admitting: Nurse Practitioner

## 2019-10-27 ENCOUNTER — Ambulatory Visit: Payer: Self-pay | Attending: Family | Admitting: Family

## 2019-10-27 ENCOUNTER — Other Ambulatory Visit: Payer: Self-pay

## 2019-10-27 DIAGNOSIS — I10 Essential (primary) hypertension: Secondary | ICD-10-CM

## 2019-10-27 DIAGNOSIS — E782 Mixed hyperlipidemia: Secondary | ICD-10-CM

## 2019-10-27 DIAGNOSIS — B309 Viral conjunctivitis, unspecified: Secondary | ICD-10-CM

## 2019-10-27 MED ORDER — LOSARTAN POTASSIUM-HCTZ 100-25 MG PO TABS: 1.0000 | ORAL_TABLET | Freq: Every day | ORAL | 1 refills | Status: DC

## 2019-10-27 MED ORDER — ATORVASTATIN CALCIUM 20 MG PO TABS: 20.0000 mg | ORAL_TABLET | Freq: Every day | ORAL | 1 refills | Status: DC

## 2019-10-27 MED ORDER — METOPROLOL SUCCINATE ER 50 MG PO TB24: 50.0000 mg | ORAL_TABLET | Freq: Every day | ORAL | 1 refills | Status: DC

## 2019-10-27 MED FILL — LOSARTAN-HCTZ 100-25 MG TAB: 100-25 | 60 days supply | Qty: 60 | Fill #0

## 2019-10-27 MED FILL — METOPROLOL SUCCINATE ER 50: 50 | 60 days supply | Qty: 60 | Fill #0

## 2019-10-27 MED FILL — ATORVASTATIN CALCIUM 20 MG: 20 | 60 days supply | Qty: 60 | Fill #0

## 2019-10-27 NOTE — Progress Notes (Signed)
Virtual Visit via Telephone Note  I connected with Paula Alexander, on 10/27/2019 at  8:51 AM by telephone due to the COVID-19 pandemic and verified that I am speaking with the correct person using two identifiers.  Due to current restrictions/limitations of in-office visits due to the COVID-19 pandemic, this scheduled clinical appointment was converted to a telehealth visit.   Consent: I discussed the limitations, risks, security and privacy concerns of performing an evaluation and management service by telephone and the availability of in person appointments. I also discussed with the patient that there may be a patient responsible charge related to this service. The patient expressed understanding and agreed to proceed.  Location of Patient: Home  Location of Provider: [facility]  Persons participating in Telemedicine visit: Paula Alexander Paula Alexander Paula Kief, NP Paula Alexander, CMA  History of Present Illness:  CC: Eye Irritation  Subjective: Paula Alexander is a 64 y.o. female with history of hypertension, peripheral vascular disease, hyperlipidemia, obesity, heart murmur, fibromyalgia, retinopathy, vitamin D deficiency, prediabetes, asthma, anemia, history of odynophagia, and history of gastroesophageal reflux who presents for eye irritation.   1. EYE COMPLAINT: Location: left eye  Onset: 1 day ago. 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 Reports she cleaned the students eye. Cannot remember if she washed her hands after cleaning the students eye. Reports later the same day her left eye became irritated. Reports it felt like something was in her eye so she tried to wash the eye. Reports the eye is very itchy. Reports she has concern because around 10 years ago she loss vision in the same eye and received eye injections and her vision was restored Description: Reports last night she  cleaned eye again and placed a gauze patch over the teary eye. Reports t this morning she removed the patch and the center of gauze patch was hard from the drainage, reports this was clear drainage.  Symptoms Discharge: yes, clear Pain: denies Photophobia: blurry and cloudy because of the drainage but if she removes the watery drainage her vision returns as normal Decreased Vision: denies  URI symptoms: denies Itching/Allergy symptoms: yes Sandy or gritty feeling in eye: yes Sensation of foreign body in eye: denies Crusting of eyelid: yes  Last eye examination: can not remember  Do you wear eyeglasses or contact lenses: prescription eyeglasses and readers   Red Flags Trauma:  denies Contact lens use: denies Vomiting or headache: denies PMH Glaucoma: thinks she had in the past  Recent eye surgery: denies   Past Medical History:  Diagnosis Date   Anemia    Calcium deficiency    Heart murmur    Hyperlipidemia    Hypertension    PVD (peripheral vascular disease) (HCC)    No Known Allergies  Current Outpatient Medications on File Prior to Visit  Medication Sig Dispense Refill   aspirin EC 81 MG tablet Take 81 mg by mouth daily.     atorvastatin (LIPITOR) 20 MG tablet Take 1 tablet (20 mg total) by mouth daily. 90 tablet 1   ferrous gluconate (FERGON) 324 MG tablet Take 1 tablet (324 mg total) by mouth daily with breakfast. 90 tablet 2   losartan-hydrochlorothiazide (HYZAAR) 100-25 MG tablet Take 1 tablet by mouth daily. 90 tablet 1   metoprolol succinate (TOPROL-XL) 50 MG 24 hr tablet Take 1 tablet (50 mg total) by mouth daily. Take with or immediately following a  meal. 90 tablet 1   No current facility-administered medications on file prior to visit.    Observations/Objective: Alert and oriented x 3. Not in acute distress. Physical examination not completed as this is a telemedicine visit.  Assessment and Plan: 1. Viral conjunctivitis, left eye: - Patient  presents today with one day history of sandy-gritty feeling, clear eye drainage, and itching of the left eye. 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 Reports she cannot remember if she washed her hands after cleaning the students eye. Reports she does have some blurry vision which resolves once she removes the clear watery drainage. Reports she did have some clear crusting this morning as well.  - Denies eye pain, double vision, decreased vision, sensation of foreign body in the eye, vomiting, and headache. - Denies use of contact lenses. Reports does wear reading glasses.  - Patient likely with viral conjunctivitis.  -  Counseled patient there is no specific topical or systemic antiviral agents for the treatment of viral conjunctivitis. - Symptomatic relief may be achieved with topical antihistamines/decongestants, which are available over-the-counter such as Ketotifen or Olopatadine. May find relief in switching from one to another.  - Warm or cool compress.  - Counseled patient on proper hand hygiene using soap and water when touching the eyes/face. - Counseled patient eye irritation and discharge may get worse for three to five days before getting better, symptoms can persist for two to three weeks, and that use of any topical agent (antibiotics or antihistamine/decongestant) for that duration might result in irritation and toxicity, which itself can cause redness and discharge. - If not improvement within 2 weeks referral to Ophthalmology advised. - Counseled patient viral conjunctivitis is highly contagious and spread by direct contact with secretions or contact with contaminated objects. Infected individuals should not share handkerchiefs, tissues, towels, cosmetics, linens, or eating utensils.  - Counseled patient the safest approach is to stay home from work until there is no longer any discharge. Patient verbalized  understanding. - Counseled patient to notify provider and seek immediate medical evaluation at the emergency department or urgent care facility if symptoms worsen and/or become severe. Patient verbalized understanding.  Follow Up Instructions: Follow-up with primary provider as needed.    Patient was given clear instructions to go to Emergency Department or return to medical center if symptoms don't improve, worsen, or new problems develop.The patient verbalized understanding.  I discussed the assessment and treatment plan with the patient. The patient was provided an opportunity to ask questions and all were answered. The patient agreed with the plan and demonstrated an understanding of the instructions.   The patient was advised to call back or seek an in-person evaluation if the symptoms worsen or if the condition fails to improve as anticipated.   I provided 21 minutes total of non-face-to-face time during this encounter including median intraservice time, reviewing previous notes, labs, imaging, medications, management and patient verbalized understanding.    Rema Fendt, NP  [facility] [place], [place] [phone]   10/27/2019, 8:43 AM

## 2019-10-27 NOTE — Patient Instructions (Addendum)
Counseled patient to notify provider and seek immediate medical evaluation at the emergency department or urgent care facility if symptoms worsen and/or become severe.  Viral Conjunctivitis, Adult  Viral conjunctivitis is an inflammation of the clear membrane that covers the white part of your eye and the inner surface of your eyelid (conjunctiva). The inflammation is caused by a viral infection. The blood vessels in the conjunctiva become inflamed, causing the eye to become red or pink, and often itchy. Viral conjunctivitis can be easily passed from one person to another (is contagious). This condition is often called pink eye. What are the causes? This condition is caused by a virus. A virus is a type of contagious germ. It can be spread by touching objects that have been contaminated with the virus, such as doorknobs or towels. It can also be passed through droplets, such as from coughing or sneezing. What are the signs or symptoms? Symptoms of this condition include:  Eye redness.  Tearing or watery eyes.  Itchy and irritated eyes.  Burning feeling in the eyes.  Clear drainage from the eye.  Swollen eyelids.  A gritty feeling in the eye.  Light sensitivity. This condition often occurs with other symptoms, such as a fever, nausea, or a rash. How is this diagnosed? This condition is diagnosed with a medical history and physical exam. If you have discharge from your eye, the discharge may be tested to rule out other causes of conjunctivitis. How is this treated? Viral conjunctivitis does not respond to medicines that kill bacteria (antibiotics). Treatment for viral conjunctivitis is directed at stopping a bacterial infection from developing in addition to the viral infection. Treatment also aims to relieve your symptoms, such as itching. This may be done with antihistamine drops or other eye medicines. Rarely, steroid eye drops or antiviral medicines may be prescribed. Follow these  instructions at home: Medicines   Take or apply over-the-counter and prescription medicines only as told by your health care provider.  Be very careful to avoid touching the edge of the eyelid with the eye drop bottle or ointment tube when applying medicines to the affected eye. Being careful this way will stop you from spreading the infection to the other eye or to other people. Eye care  Avoid touching or rubbing your eyes.  Apply a warm, wet, clean washcloth to your eye for 10-20 minutes, 3-4 times per day or as told by your health care provider.  If you wear contact lenses, do not wear them until the inflammation is gone and your health care provider says it is safe to wear them again. Ask your health care provider how to sterilize or replace your contact lenses before using them again. Wear glasses until you can resume wearing contacts.  Avoid wearing eye makeup until the inflammation is gone. Throw away any old eye cosmetics that may be contaminated.  Gently wipe away any drainage from your eye with a warm, wet washcloth or a cotton ball. General instructions  Change or wash your pillowcase every day or as told by your health care provider.  Do not share towels, pillowcases, washcloths, eye makeup, makeup brushes, contact lenses, or glasses. This may spread the infection.  Wash your hands often with soap and water. Use paper towels to dry your hands. If soap and water are not available, use hand sanitizer.  Try to avoid contact with other people for one week or as told by your health care provider. Contact a health care provider if:  Your symptoms do not improve with treatment or they get worse.  You have increased pain.  Your vision becomes blurry.  You have a fever.  You have facial pain, redness, or swelling.  You have yellow or green drainage coming from your eye.  You have new symptoms. This information is not intended to replace advice given to you by your health  care provider. Make sure you discuss any questions you have with your health care provider. Document Revised: 06/22/2018 Document Reviewed: 09/18/2015 Elsevier Patient Education  2020 [employer].

## 2019-10-27 NOTE — Telephone Encounter (Signed)
Pt request refill  atorvastatin (LIPITOR) 20 MG tablet(   losartan-hydrochlorothiazide (HYZAAR) 100-25 MG tablet metoprolol succinate (TOPROL-XL) 50 MG 24 hr tablet  [facility] [place], [place] - [address] Phone:  [phone]  Fax:  [phone]

## 2019-12-30 ENCOUNTER — Telehealth: Payer: Self-pay | Admitting: Nurse Practitioner

## 2019-12-30 NOTE — Telephone Encounter (Signed)
Patient is calling to check into if her health assessment form is ready please advise CB- [phone]

## 2020-01-04 NOTE — Telephone Encounter (Signed)
Pt. Received the form.

## 2020-01-05 MED FILL — ATORVASTATIN CALCIUM 20 MG: 20 | 60 days supply | Qty: 60 | Fill #1

## 2020-01-05 MED FILL — METOPROLOL SUCCINATE ER 50: 50 | 60 days supply | Qty: 60 | Fill #1

## 2020-01-05 MED FILL — LOSARTAN-HCTZ 100-25 MG TAB: 100-25 | 60 days supply | Qty: 60 | Fill #1

## 2020-01-09 ENCOUNTER — Ambulatory Visit: Payer: Self-pay | Attending: Nurse Practitioner | Admitting: Pharmacist

## 2020-01-09 ENCOUNTER — Other Ambulatory Visit: Payer: Self-pay

## 2020-01-09 DIAGNOSIS — Z23 Encounter for immunization: Secondary | ICD-10-CM

## 2020-01-09 NOTE — Progress Notes (Signed)
Patient presents for vaccination against influenza per orders of [name]. Consent given. Counseling provided. No contraindications exists. Vaccine administered without incident.  ° °Luke Van Ausdall, PharmD, CPP °Clinical Pharmacist °[facility] °[phone] ° °

## 2020-02-29 MED FILL — LOSARTAN-HCTZ 100-25 MG TAB: 100-25 | 60 days supply | Qty: 60 | Fill #2

## 2020-02-29 MED FILL — ATORVASTATIN CALCIUM 20 MG: 20 | 60 days supply | Qty: 60 | Fill #2

## 2020-02-29 MED FILL — METOPROLOL SUCCINATE ER 50: 50 | 60 days supply | Qty: 60 | Fill #2

## 2020-05-02 ENCOUNTER — Telehealth: Payer: Self-pay | Admitting: Nurse Practitioner

## 2020-05-02 DIAGNOSIS — E782 Mixed hyperlipidemia: Secondary | ICD-10-CM

## 2020-05-02 DIAGNOSIS — E559 Vitamin D deficiency, unspecified: Secondary | ICD-10-CM

## 2020-05-02 DIAGNOSIS — I10 Essential (primary) hypertension: Secondary | ICD-10-CM

## 2020-05-02 DIAGNOSIS — D649 Anemia, unspecified: Secondary | ICD-10-CM

## 2020-05-04 ENCOUNTER — Other Ambulatory Visit: Payer: Self-pay | Admitting: Family Medicine

## 2020-05-04 MED ORDER — FERROUS GLUCONATE 324 (38 FE) MG PO TABS: 324.0000 mg | ORAL_TABLET | Freq: Every day | ORAL | 0 refills | Status: DC

## 2020-05-04 MED ORDER — METOPROLOL SUCCINATE ER 50 MG PO TB24: 50.0000 mg | ORAL_TABLET | Freq: Every day | ORAL | 0 refills | Status: DC

## 2020-05-04 MED ORDER — ATORVASTATIN CALCIUM 20 MG PO TABS: 20.0000 mg | ORAL_TABLET | Freq: Every day | ORAL | 0 refills | Status: DC

## 2020-05-04 MED ORDER — LOSARTAN POTASSIUM-HCTZ 100-25 MG PO TABS: 1.0000 | ORAL_TABLET | Freq: Every day | ORAL | 0 refills | Status: DC

## 2020-05-04 MED FILL — LOSARTAN-HCTZ 100-25 MG TAB: 100-25 | 60 days supply | Qty: 60 | Fill #0

## 2020-05-04 MED FILL — METOPROLOL SUCCINATE ER 50: 50 | 60 days supply | Qty: 60 | Fill #0

## 2020-05-04 MED FILL — ATORVASTATIN CALCIUM 20 MG: 20 | 60 days supply | Qty: 60 | Fill #0

## 2020-05-04 NOTE — Telephone Encounter (Signed)
Patient will need an OV prior to additional refill. Last OV was 6 mos. Attempted to reach out to schedule an appt but no answer and voicemail not set up.  Will need apt with PCP Will forward call with business services to schedule an apt.

## 2020-05-04 NOTE — Addendum Note (Signed)
Addended by: Lois Huxley, Jeannett Senior L on: 05/04/2020 02:07 PM   Modules accepted: Orders

## 2020-05-04 NOTE — Telephone Encounter (Signed)
Call placed to patient, connected with her. I advised her that she needed a PCP appointment prior to any refills. She stated she has an upcoming appointment on April 1st with Meredeth Ide and did not want to schedule anything sooner. Can she get medications refilled to hold her over until then? Please advise.

## 2020-05-04 NOTE — Telephone Encounter (Signed)
Pt called for update on refill requests. Pt request call back.

## 2020-05-04 NOTE — Telephone Encounter (Signed)
Rx sent 

## 2020-05-04 NOTE — Telephone Encounter (Signed)
It maybe because  I am calling from an unrecognized number at a different site.   She has not had labs in over 1 year. Pushing refills out that far is not in the protocol. She would need to f/u before April for additional refills.

## 2020-06-15 ENCOUNTER — Encounter: Payer: Self-pay | Admitting: Nurse Practitioner

## 2020-06-15 ENCOUNTER — Other Ambulatory Visit: Payer: Self-pay

## 2020-06-15 ENCOUNTER — Ambulatory Visit: Payer: Self-pay | Attending: Nurse Practitioner | Admitting: Nurse Practitioner

## 2020-06-15 VITALS — BP 132/70 | HR 77 | Resp 17 | Ht 62.0 in | Wt 203.4 lb

## 2020-06-15 DIAGNOSIS — Z1211 Encounter for screening for malignant neoplasm of colon: Secondary | ICD-10-CM

## 2020-06-15 DIAGNOSIS — E785 Hyperlipidemia, unspecified: Secondary | ICD-10-CM

## 2020-06-15 DIAGNOSIS — I10 Essential (primary) hypertension: Secondary | ICD-10-CM

## 2020-06-15 DIAGNOSIS — D649 Anemia, unspecified: Secondary | ICD-10-CM

## 2020-06-15 DIAGNOSIS — Z1231 Encounter for screening mammogram for malignant neoplasm of breast: Secondary | ICD-10-CM

## 2020-06-15 DIAGNOSIS — R7303 Prediabetes: Secondary | ICD-10-CM

## 2020-06-15 LAB — POCT GLYCOSYLATED HEMOGLOBIN (HGB A1C): HbA1c, POC (controlled diabetic range): 5.8 % (ref 0.0–7.0)

## 2020-06-15 MED ORDER — FERROUS GLUCONATE 324 (38 FE) MG PO TABS: 324.0000 mg | ORAL_TABLET | Freq: Every day | ORAL | 0 refills | Status: DC

## 2020-06-15 MED ORDER — ASPIRIN EC 81 MG PO TBEC: 81.0000 mg | DELAYED_RELEASE_TABLET | Freq: Every day | ORAL | 3 refills | Status: DC

## 2020-06-15 MED ORDER — METOPROLOL SUCCINATE ER 50 MG PO TB24
50.0000 mg | ORAL_TABLET | Freq: Every day | ORAL | 0 refills | Status: DC
  Filled 2020-06-18: qty 30, 30d supply, fill #0

## 2020-06-15 MED ORDER — LOSARTAN POTASSIUM-HCTZ 100-25 MG PO TABS
1.0000 | ORAL_TABLET | Freq: Every day | ORAL | 0 refills | Status: DC
  Filled 2020-06-18: qty 30, 30d supply, fill #0

## 2020-06-15 MED ORDER — ATORVASTATIN CALCIUM 20 MG PO TABS
20.0000 mg | ORAL_TABLET | Freq: Every day | ORAL | 0 refills | Status: DC
  Filled 2020-06-18: qty 30, 30d supply, fill #0

## 2020-06-15 NOTE — Progress Notes (Signed)
Assessment & Plan:  Paula Alexander was seen today for medication refill.  Diagnoses and all orders for this visit:  Primary hypertension -     CMP14+EGFR -     aspirin EC 81 MG tablet; Take 1 tablet (81 mg total) by mouth daily. -     losartan-hydrochlorothiazide (HYZAAR) 100-25 MG tablet; Take 1 tablet by mouth daily. -     metoprolol succinate (TOPROL-XL) 50 MG 24 hr tablet; Take 1 tablet (50 mg total) by mouth daily. Take with or immediately following a meal.  Prediabetes -     Cancel: POCT glucose (manual entry) -     POCT glycosylated hemoglobin (Hb A1C)  Dyslipidemia, goal LDL below 100 -     Lipid panel -     atorvastatin (LIPITOR) 20 MG tablet; Take 1 tablet (20 mg total) by mouth daily.  Anemia, unspecified type -     CBC -     ferrous gluconate (FERGON) 324 MG tablet; Take 1 tablet (324 mg total) by mouth daily with breakfast.  Breast cancer screening by mammogram -     MM DIGITAL SCREENING BILATERAL; Future  Colon cancer screening -     Fecal occult blood, imunochemical([facility])    Patient has been counseled on age-appropriate routine health concerns for screening and prevention. These are reviewed and up-to-date. Referrals have been placed accordingly. Immunizations are up-to-date or declined.    Subjective:   Chief Complaint  Patient presents with  . Medication Refill   HPI Paula Alexander 66 y.o. female presents to office today for follow up. She has a past medical history of Anemia, Calcium deficiency, Heart murmur, Hyperlipidemia, Hypertension, and PVD.   Essential Hypertension Well controlled. Taking Losartan-Hctz 100-25 mg daily and Toprol XL 50 mg daily. Denies chest pain, shortness of breath, palpitations, lightheadedness, dizziness, headaches or BLE edema.  BP Readings from Last 3 Encounters:  06/15/20 132/70  06/07/19 122/72  04/04/19 137/77   Prediabetes Well controlled with diet only at this time.  Lab Results  Component Value Date    HGBA1C 5.8 06/15/2020   Lab Results  Component Value Date   HGBA1C 6.1 (A) 04/04/2019   Lab Results  Component Value Date   LDLCALC 86 04/04/2019    Notes neck, shoulders and back pain. Chronic. Feels it is related to the size of her breasts which have increased in size over the past few years. Would like to look into possible breast reduction surgery in the near future.     Review of Systems  Constitutional: Negative for fever, malaise/fatigue and weight loss.  HENT: Negative.  Negative for nosebleeds.   Eyes: Negative.  Negative for blurred vision, double vision and photophobia.  Respiratory: Negative.  Negative for cough and shortness of breath.   Cardiovascular: Negative.  Negative for chest pain, palpitations and leg swelling.  Gastrointestinal: Negative.  Negative for heartburn, nausea and vomiting.  Musculoskeletal: Positive for back pain, joint pain, myalgias and neck pain.  Neurological: Negative.  Negative for dizziness, focal weakness, seizures and headaches.  Psychiatric/Behavioral: Negative.  Negative for suicidal ideas.    Past Medical History:  Diagnosis Date  . Anemia   . Calcium deficiency   . Heart murmur   . Hyperlipidemia   . Hypertension   . PVD (peripheral vascular disease) ([facility])     History reviewed. No pertinent surgical history.  Family History  Problem Relation Age of Onset  . Hypertension Mother   . Stroke Mother   . Breast cancer  Mother   . Multiple sclerosis Sister   . Breast cancer Sister   . Diabetes Son   . Colon cancer Neg Hx   . Esophageal cancer Neg Hx   . Inflammatory bowel disease Neg Hx   . Liver disease Neg Hx   . Pancreatic cancer Neg Hx   . Rectal cancer Neg Hx   . Stomach cancer Neg Hx     Social History Reviewed with no changes to be made today.   Outpatient Medications Prior to Visit  Medication Sig Dispense Refill  . aspirin EC 81 MG tablet Take 81 mg by mouth daily.    Marland Kitchen atorvastatin (LIPITOR) 20 MG tablet  Take 1 tablet (20 mg total) by mouth daily. 90 tablet 0  . ferrous gluconate (FERGON) 324 MG tablet Take 1 tablet (324 mg total) by mouth daily with breakfast. 90 tablet 0  . losartan-hydrochlorothiazide (HYZAAR) 100-25 MG tablet Take 1 tablet by mouth daily. 90 tablet 0  . metoprolol succinate (TOPROL-XL) 50 MG 24 hr tablet Take 1 tablet (50 mg total) by mouth daily. Take with or immediately following a meal. 90 tablet 0   No facility-administered medications prior to visit.    No Known Allergies     Objective:    BP 132/70   Pulse 77   Resp 17   Ht _0  (1.575 m)   Wt 203 lb 6.4 oz (92.3 kg)   SpO2 97%   BMI 37.20 kg/m  Wt Readings from Last 3 Encounters:  06/15/20 203 lb 6.4 oz (92.3 kg)  06/07/19 189 lb (85.7 kg)  04/04/19 193 lb (87.5 kg)    Physical Exam Vitals and nursing note reviewed.  Constitutional:      Appearance: She is well-developed.  HENT:     Head: Normocephalic and atraumatic.  Cardiovascular:     Rate and Rhythm: Normal rate and regular rhythm.     Heart sounds: Normal heart sounds. No murmur heard. No friction rub. No gallop.   Pulmonary:     Effort: Pulmonary effort is normal. No tachypnea or respiratory distress.     Breath sounds: Normal breath sounds. No decreased breath sounds, wheezing, rhonchi or rales.  Chest:     Chest wall: No tenderness.  Abdominal:     General: Bowel sounds are normal.     Palpations: Abdomen is soft.  Musculoskeletal:        General: Normal range of motion.     Cervical back: Normal range of motion.  Skin:    General: Skin is warm and dry.  Neurological:     Mental Status: She is alert and oriented to person, place, and time.     Coordination: Coordination normal.  Psychiatric:        Behavior: Behavior normal. Behavior is cooperative.        Thought Content: Thought content normal.        Judgment: Judgment normal.          Patient has been counseled extensively about nutrition and exercise as well as  the importance of adherence with medications and regular follow-up. The patient was given clear instructions to go to ER or return to medical center if symptoms don't improve, worsen or new problems develop. The patient verbalized understanding.   Follow-up: Return in about 6 months (around 12/15/2020).   Gildardo Pounds, FNP-BC [facility] [place], [place]   06/15/2020, 10:00 PM

## 2020-06-16 ENCOUNTER — Other Ambulatory Visit: Payer: Self-pay

## 2020-06-16 LAB — CMP14+EGFR
ALT: 32 IU/L (ref 0–32)
AST: 39 IU/L (ref 0–40)
Albumin/Globulin Ratio: 1.5 (ref 1.2–2.2)
Albumin: 4.1 g/dL (ref 3.8–4.8)
Alkaline Phosphatase: 91 IU/L (ref 44–121)
BUN/Creatinine Ratio: 16 (ref 12–28)
BUN: 17 mg/dL (ref 8–27)
Bilirubin Total: <0.2 mg/dL (ref 0.0–1.2)
CO2: 26 mmol/L (ref 20–29)
Calcium: 9.9 mg/dL (ref 8.7–10.3)
Chloride: 105 mmol/L (ref 96–106)
Creatinine, Ser: 1.05 mg/dL — ABNORMAL HIGH (ref 0.57–1.00)
Globulin, Total: 2.7 g/dL (ref 1.5–4.5)
Glucose: 81 mg/dL (ref 65–99)
Potassium: 4.9 mmol/L (ref 3.5–5.2)
Sodium: 144 mmol/L (ref 134–144)
Total Protein: 6.8 g/dL (ref 6.0–8.5)
eGFR: 59 mL/min/{1.73_m2} — ABNORMAL LOW (ref >59)

## 2020-06-16 LAB — CBC
Hematocrit: 35.7 % (ref 34.0–46.6)
Hemoglobin: 11.6 g/dL (ref 11.1–15.9)
MCH: 28.8 pg (ref 26.6–33.0)
MCHC: 32.5 g/dL (ref 31.5–35.7)
MCV: 89 fL (ref 79–97)
Platelets: 211 10*3/uL (ref 150–450)
RBC: 4.03 x10E6/uL (ref 3.77–5.28)
RDW: 13.3 % (ref 11.7–15.4)
WBC: 5.4 10*3/uL (ref 3.4–10.8)

## 2020-06-16 LAB — LIPID PANEL
Chol/HDL Ratio: 2.6 ratio (ref 0.0–4.4)
Cholesterol, Total: 138 mg/dL (ref 100–199)
HDL: 54 mg/dL (ref >39)
LDL Chol Calc (NIH): 71 mg/dL (ref 0–99)
Triglycerides: 60 mg/dL (ref 0–149)
VLDL Cholesterol Cal: 13 mg/dL (ref 5–40)

## 2020-06-18 ENCOUNTER — Other Ambulatory Visit: Payer: Self-pay

## 2020-06-18 ENCOUNTER — Telehealth: Payer: Self-pay

## 2020-06-18 NOTE — Telephone Encounter (Signed)
-----   Message from Claiborne Rigg, NP sent at 06/17/2020  3:16 PM EDT ----- Cholesterol levels are normal. CBC does not indicate any anemia or bleeding disorders. Kidney, liver function and electrolytes are normal.

## 2020-06-18 NOTE — Telephone Encounter (Signed)
Pt informed of labs results no other concerns voiced. Copy of lab results also printed and left at front desk per patient request.

## 2020-06-19 ENCOUNTER — Other Ambulatory Visit: Payer: Self-pay

## 2020-07-16 ENCOUNTER — Ambulatory Visit: Payer: Self-pay

## 2020-07-16 NOTE — Telephone Encounter (Signed)
Exposed  Grandchild who was sick with URI last week. Worse last Tuesday. Tested negative for flu A and B and covid. Diarrhea and vomiting body aches headache, sore throat Was getting better by Thursday. Was taking allergy medicine. Saturday Alka Seltzer sinus cold and flu and was feeling better. More active by Sunday but tired out and had to rest. No nasal discharge or fever. Has a poor appetite but is forcing herself to eat. Pt is tolerating fluids now but will try and start drinking fluids  And fluids with  electrolytes. No fever, diarrhea, or sore throat.  Care advice given to pt and pt verbalized understanding.  Reason for Disposition . [1] Sinus congestion as part of a cold AND [2] present < 10 days  Answer Assessment - Initial Assessment Questions 1. LOCATION: "Where does it hurt?"      Facial pain to cheek sore  sensitive teeth 2. ONSET: "When did the sinus pain start?"  (e.g., hours, days)      *No Answer* 3. SEVERITY: "How bad is the pain?"   (Scale 1-10; mild, moderate or severe)   - MILD (1-3): doesn't interfere with normal activities    - MODERATE (4-7): interferes with normal activities (e.g., work or school) or awakens from sleep   - SEVERE (8-10): excruciating pain and patient unable to do any normal activities       Mild 4. RECURRENT SYMPTOM: "Have you ever had sinus problems before?" If Yes, ask: "When was the last time?" and "What happened that time?"      Yes - IV abx for face swollen- 5. NASAL CONGESTION: "Is the nose blocked?" If Yes, ask: "Can you open it or must you breathe through the mouth?"     no 6. NASAL DISCHARGE: "Do you have discharge from your nose?" If so ask, "What color?"    Not having drainage 7. FEVER: "Do you have a fever?" If Yes, ask: "What is it, how was it measured, and when did it start?"      no 8. OTHER SYMPTOMS: "Do you have any other symptoms?" (e.g., sore throat, cough, earache, difficulty breathing)     Weakness, cough, throat dry, poor  appetite, lost weight 11 lbs.  Protocols used: SINUS PAIN OR CONGESTION-A-AH

## 2020-07-18 ENCOUNTER — Other Ambulatory Visit: Payer: Self-pay

## 2020-07-18 ENCOUNTER — Ambulatory Visit: Payer: Self-pay | Admitting: Physician Assistant

## 2020-07-18 VITALS — BP 122/78 | HR 68 | Temp 98.2°F | Resp 18 | Ht 62.0 in | Wt 195.0 lb

## 2020-07-18 DIAGNOSIS — J0111 Acute recurrent frontal sinusitis: Secondary | ICD-10-CM

## 2020-07-18 LAB — POC COVID19 BINAXNOW: SARS Coronavirus 2 Ag: NEGATIVE

## 2020-07-18 MED ORDER — FLUTICASONE PROPIONATE 50 MCG/ACT NA SUSP: 2.0000 | Freq: Every day | NASAL | 6 refills | Status: DC

## 2020-07-18 MED ORDER — CETIRIZINE HCL 10 MG PO TABS: 10.0000 mg | ORAL_TABLET | Freq: Every day | ORAL | 11 refills | Status: DC

## 2020-07-18 MED ORDER — AMOXICILLIN-POT CLAVULANATE 875-125 MG PO TABS: 1.0000 | ORAL_TABLET | Freq: Two times a day (BID) | ORAL | 0 refills | Status: DC

## 2020-07-18 NOTE — Progress Notes (Signed)
Established Patient Office Visit  Subjective:  Patient ID: Paula Alexander, female    DOB: 1955/04/08  Age: 64 y.o. MRN: [id number]  CC:  Chief Complaint  Patient presents with  . Sinusitis    HPI Paula Alexander reports that she has been having sinus pressure, stuffy ears, headaches, green nasal discharge, unmeasured fever, chills for the last 8 days.  Reports that she has been using over-the-counter allergy medication, sinus irrigation, without much relief.  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     Past Medical History:  Diagnosis Date  . Anemia   . Calcium deficiency   . Heart murmur   . Hyperlipidemia   . Hypertension   . PVD (peripheral vascular disease) (HCC)     No past surgical history on file.  Family History  Problem Relation Age of Onset  . Hypertension Mother   . Stroke Mother   . Breast cancer Mother   . Multiple sclerosis Sister   . Breast cancer Sister   . Diabetes Son   . Colon cancer Neg Hx   . Esophageal cancer Neg Hx   . Inflammatory bowel disease Neg Hx   . Liver disease Neg Hx   . Pancreatic cancer Neg Hx   . Rectal cancer Neg Hx   . Stomach cancer Neg Hx     Social History   Socioeconomic History  . Marital status: Legally Separated    Spouse name: Not on file  . Number of children: Not on file  . Years of education: Not on file  . Highest education level: Not on file  Occupational History  . Not on file  Tobacco Use  . Smoking status: Never Smoker  . Smokeless tobacco: Never Used  Vaping Use  . Vaping Use: Not on file  Substance and Sexual Activity  . Alcohol use: No  . Drug use: No  . Sexual activity: Not on file  Other Topics Concern  . Not on file  Social History Narrative  . Not on file   Social Determinants of Health   Financial Resource Strain: Not on file  Food Insecurity: Not on file  Transportation Needs: Not on file  Physical Activity: Not on file   Stress: Not on file  Social Connections: Not on file  Intimate Partner Violence: Not on file    Outpatient Medications Prior to Visit  Medication Sig Dispense Refill  . aspirin EC 81 MG tablet Take 1 tablet (81 mg total) by mouth daily. 90 tablet 3  . atorvastatin (LIPITOR) 20 MG tablet Take 1 tablet (20 mg total) by mouth daily. 90 tablet 0  . ferrous gluconate (FERGON) 324 MG tablet Take 1 tablet (324 mg total) by mouth daily with breakfast. 90 tablet 0  . losartan-hydrochlorothiazide (HYZAAR) 100-25 MG tablet Take 1 tablet by mouth daily. 90 tablet 0  . metoprolol succinate (TOPROL-XL) 50 MG 24 hr tablet Take 1 tablet (50 mg total) by mouth daily. Take with or immediately following a meal. 90 tablet 0   No facility-administered medications prior to visit.    No Known Allergies  ROS Review of Systems  Constitutional: Positive for chills, fatigue and fever.  HENT: Positive for congestion, sinus pressure, sinus pain and sneezing. Negative for ear discharge, ear pain, sore throat and trouble swallowing.   Eyes: Negative.   Respiratory: Negative for cough and shortness of breath.   Cardiovascular: Negative for chest pain.  Gastrointestinal: Negative  for nausea and vomiting.  Endocrine: Negative.   Genitourinary: Negative.   Musculoskeletal: Negative for myalgias.  Skin: Negative.   Allergic/Immunologic: Negative.   Neurological: Positive for headaches.  Hematological: Negative.   Psychiatric/Behavioral: Negative.       Objective:    Physical Exam Vitals and nursing note reviewed.  Constitutional:      General: She is not in acute distress.    Appearance: Normal appearance. She is not ill-appearing.  HENT:     Head: Normocephalic.     Right Ear: Tympanic membrane, ear canal and external ear normal.     Left Ear: Tympanic membrane, ear canal and external ear normal.     Nose:     Right Turbinates: Swollen.     Left Turbinates: Swollen.     Right Sinus: Maxillary sinus  tenderness and frontal sinus tenderness present.     Left Sinus: Maxillary sinus tenderness and frontal sinus tenderness present.     Mouth/Throat:     Mouth: Mucous membranes are moist.     Pharynx: Oropharynx is clear.     Tonsils: No tonsillar exudate.  Eyes:     Extraocular Movements: Extraocular movements intact.     Conjunctiva/sclera: Conjunctivae normal.     Pupils: Pupils are equal, round, and reactive to light.  Cardiovascular:     Rate and Rhythm: Normal rate and regular rhythm.     Pulses: Normal pulses.     Heart sounds: Normal heart sounds.  Pulmonary:     Effort: Pulmonary effort is normal.     Breath sounds: Normal breath sounds. No wheezing.  Musculoskeletal:        General: Normal range of motion.     Cervical back: Normal range of motion and neck supple.  Lymphadenopathy:     Cervical: Cervical adenopathy present.  Skin:    General: Skin is warm.  Neurological:     General: No focal deficit present.     Mental Status: She is alert and oriented to person, place, and time.  Psychiatric:        Mood and Affect: Mood normal.        Behavior: Behavior normal.        Thought Content: Thought content normal.        Judgment: Judgment normal.     BP 122/78 (BP Location: Left Arm, Patient Position: Sitting, Cuff Size: Normal)   Pulse 68   Temp 98.2 F (36.8 C) (Oral)   Resp 18   Ht $R'5\' 2"'Zz$  (1.575 m)   Wt 195 lb (88.5 kg)   SpO2 100%   BMI 35.67 kg/m  Wt Readings from Last 3 Encounters:  07/18/20 195 lb (88.5 kg)  06/15/20 203 lb 6.4 oz (92.3 kg)  06/07/19 189 lb (85.7 kg)     Health Maintenance Due  Topic Date Due  . MAMMOGRAM  10/01/2016  . COVID-19 Vaccine (2 - Pfizer 3-dose series) 06/04/2019  . DEXA SCAN  06/05/2020  . PNA vac Low Risk Adult (1 of 2 - PCV13) Never done    There are no preventive care reminders to display for this patient.  Lab Results  Component Value Date   TSH 0.483 06/30/2014   Lab Results  Component Value Date   WBC  5.4 06/15/2020   HGB 11.6 06/15/2020   HCT 35.7 06/15/2020   MCV 89 06/15/2020   PLT 211 06/15/2020   Lab Results  Component Value Date   NA 144 06/15/2020   K 4.9 06/15/2020  CO2 26 06/15/2020   GLUCOSE 81 06/15/2020   BUN 17 06/15/2020   CREATININE 1.05 (H) 06/15/2020   BILITOT <0.2 06/15/2020   ALKPHOS 91 06/15/2020   AST 39 06/15/2020   ALT 32 06/15/2020   PROT 6.8 06/15/2020   ALBUMIN 4.1 06/15/2020   CALCIUM 9.9 06/15/2020   EGFR 59 (L) 06/15/2020   Lab Results  Component Value Date   CHOL 138 06/15/2020   Lab Results  Component Value Date   HDL 54 06/15/2020   Lab Results  Component Value Date   LDLCALC 71 06/15/2020   Lab Results  Component Value Date   TRIG 60 06/15/2020   Lab Results  Component Value Date   CHOLHDL 2.6 06/15/2020   Lab Results  Component Value Date   HGBA1C 5.8 06/15/2020      Assessment & Plan:   Problem List Items Addressed This Visit   None   Visit Diagnoses    Acute recurrent frontal sinusitis    -  Primary   Relevant Medications   amoxicillin-clavulanate (AUGMENTIN) 875-125 MG tablet   cetirizine (ZYRTEC ALLERGY) 10 MG tablet   fluticasone (FLONASE) 50 MCG/ACT nasal spray   Other Relevant Orders   POC COVID-19 (Completed)      Meds ordered this encounter  Medications  . amoxicillin-clavulanate (AUGMENTIN) 875-125 MG tablet    Sig: Take 1 tablet by mouth 2 (two) times daily.    Dispense:  20 tablet    Refill:  0    Order Specific Question:   Supervising Provider    Answer:   Asencion Noble E [[id number]]  . cetirizine (ZYRTEC ALLERGY) 10 MG tablet    Sig: Take 1 tablet (10 mg total) by mouth daily.    Dispense:  30 tablet    Refill:  11    Order Specific Question:   Supervising Provider    Answer:   Asencion Noble E [[id number]]  . fluticasone (FLONASE) 50 MCG/ACT nasal spray    Sig: Place 2 sprays into both nostrils daily.    Dispense:  16 g    Refill:  6    Order Specific Question:   Supervising Provider     Answer:   Joya Gaskins, PATRICK E [[id number]]   1. Acute recurrent frontal sinusitis Rapid COVID test negative.  Trial Augmentin, Zyrtec, Flonase.  Patient education given on staying hydrated, rest, allergy avoidance.  Red flags given for prompt reevaluation. - POC COVID-19 - amoxicillin-clavulanate (AUGMENTIN) 875-125 MG tablet; Take 1 tablet by mouth 2 (two) times daily.  Dispense: 20 tablet; Refill: 0 - cetirizine (ZYRTEC ALLERGY) 10 MG tablet; Take 1 tablet (10 mg total) by mouth daily.  Dispense: 30 tablet; Refill: 11 - fluticasone (FLONASE) 50 MCG/ACT nasal spray; Place 2 sprays into both nostrils daily.  Dispense: 16 g; Refill: 6   I have reviewed the patient's medical history (PMH, PSH, Social History, Family History, Medications, and allergies) , and have been updated if relevant. I spent 26 minutes reviewing chart and  face to face time with patient.     Follow-up: Return if symptoms worsen or fail to improve.    Loraine Grip Mayers, PA-C

## 2020-07-18 NOTE — Patient Instructions (Signed)
For your sinus infection, you will use Augmentin twice a day until you have finished the prescription.  I encourage you to start taking Zyrtec on a daily basis, and use the Flonase until your symptoms resolve.  Make sure that you are staying well-hydrated, and get plenty of rest.  Please let us know if there is anything else we can do for you.   Roney Jaffe, PA-C Physician Assistant [facility] [email or web address]    Sinusitis, Adult Sinusitis is inflammation of your sinuses. Sinuses are hollow spaces in the bones around your face. Your sinuses are located:  Around your eyes.  In the middle of your forehead.  Behind your nose.  In your cheekbones. Mucus normally drains out of your sinuses. When your nasal tissues become inflamed or swollen, mucus can become trapped or blocked. This allows bacteria, viruses, and fungi to grow, which leads to infection. Most infections of the sinuses are caused by a virus. Sinusitis can develop quickly. It can last for up to 4 weeks (acute) or for more than 12 weeks (chronic). Sinusitis often develops after a cold. What are the causes? This condition is caused by anything that creates swelling in the sinuses or stops mucus from draining. This includes:  Allergies.  Asthma.  Infection from bacteria or viruses.  Deformities or blockages in your nose or sinuses.  Abnormal growths in the nose (nasal polyps).  Pollutants, such as chemicals or irritants in the air.  Infection from fungi (rare). What increases the risk? You are more likely to develop this condition if you:  Have a weak body defense system (immune system).  Do a lot of swimming or diving.  Overuse nasal sprays.  Smoke. What are the signs or symptoms? The main symptoms of this condition are pain and a feeling of pressure around the affected sinuses. Other symptoms include:  Stuffy nose or congestion.  Thick  drainage from your nose.  Swelling and warmth over the affected sinuses.  Headache.  Upper toothache.  A cough that may get worse at night.  Extra mucus that collects in the throat or the back of the nose (postnasal drip).  Decreased sense of smell and taste.  Fatigue.  A fever.  Sore throat.  Bad breath. How is this diagnosed? This condition is diagnosed based on:  Your symptoms.  Your medical history.  A physical exam.  Tests to find out if your condition is acute or chronic. This may include: ? Checking your nose for nasal polyps. ? Viewing your sinuses using a device that has a light (endoscope). ? Testing for allergies or bacteria. ? Imaging tests, such as an MRI or CT scan. In rare cases, a bone biopsy may be done to rule out more serious types of fungal sinus disease. How is this treated? Treatment for sinusitis depends on the cause and whether your condition is chronic or acute.  If caused by a virus, your symptoms should go away on their own within 10 days. You may be given medicines to relieve symptoms. They include: ? Medicines that shrink swollen nasal passages (topical intranasal decongestants). ? Medicines that treat allergies (antihistamines). ? A spray that eases inflammation of the nostrils (topical intranasal corticosteroids). ? Rinses that help get rid of thick mucus in your nose (nasal saline washes).  If caused by bacteria, your health care provider may recommend waiting to see if your symptoms improve. Most bacterial infections will get better without antibiotic medicine. You may be given antibiotics if you  have: ? A severe infection. ? A weak immune system.  If caused by narrow nasal passages or nasal polyps, you may need to have surgery. Follow these instructions at home: Medicines  Take, use, or apply over-the-counter and prescription medicines only as told by your health care provider. These may include nasal sprays.  If you were  prescribed an antibiotic medicine, take it as told by your health care provider. Do not stop taking the antibiotic even if you start to feel better. Hydrate and humidify  Drink enough fluid to keep your urine pale yellow. Staying hydrated will help to thin your mucus.  Use a cool mist humidifier to keep the humidity level in your home above 50%.  Inhale steam for 10-15 minutes, 3-4 times a day, or as told by your health care provider. You can do this in the bathroom while a hot shower is running.  Limit your exposure to cool or dry air.   Rest  Rest as much as possible.  Sleep with your head raised (elevated).  Make sure you get enough sleep each night. General instructions  Apply a warm, moist washcloth to your face 3-4 times a day or as told by your health care provider. This will help with discomfort.  Wash your hands often with soap and water to reduce your exposure to germs. If soap and water are not available, use hand sanitizer.  Do not smoke. Avoid being around people who are smoking (secondhand smoke).  Keep all follow-up visits as told by your health care provider. This is important.   Contact a health care provider if:  You have a fever.  Your symptoms get worse.  Your symptoms do not improve within 10 days. Get help right away if:  You have a severe headache.  You have persistent vomiting.  You have severe pain or swelling around your face or eyes.  You have vision problems.  You develop confusion.  Your neck is stiff.  You have trouble breathing. Summary  Sinusitis is soreness and inflammation of your sinuses. Sinuses are hollow spaces in the bones around your face.  This condition is caused by nasal tissues that become inflamed or swollen. The swelling traps or blocks the flow of mucus. This allows bacteria, viruses, and fungi to grow, which leads to infection.  If you were prescribed an antibiotic medicine, take it as told by your health care  provider. Do not stop taking the antibiotic even if you start to feel better.  Keep all follow-up visits as told by your health care provider. This is important. This information is not intended to replace advice given to you by your health care provider. Make sure you discuss any questions you have with your health care provider. Document Revised: 08/03/2017 Document Reviewed: 08/03/2017 Elsevier Patient Education  2021 [employer].

## 2020-07-18 NOTE — Progress Notes (Signed)
Patient reports extreme fatigue with falling asleep at work. Patient completed medications and still has ache in her cheek bones. Patient reports green mucous. Patient has been using OTC nasal spray and cough medication that would not elevate BP.

## 2020-07-19 DIAGNOSIS — J0111 Acute recurrent frontal sinusitis: Secondary | ICD-10-CM | POA: Insufficient documentation

## 2020-08-08 ENCOUNTER — Other Ambulatory Visit: Payer: Self-pay

## 2020-08-09 ENCOUNTER — Other Ambulatory Visit: Payer: Self-pay

## 2020-08-15 ENCOUNTER — Other Ambulatory Visit: Payer: Self-pay

## 2020-10-05 ENCOUNTER — Other Ambulatory Visit: Payer: Self-pay | Admitting: Nurse Practitioner

## 2020-10-05 DIAGNOSIS — I10 Essential (primary) hypertension: Secondary | ICD-10-CM

## 2020-10-05 DIAGNOSIS — E785 Hyperlipidemia, unspecified: Secondary | ICD-10-CM

## 2020-10-15 ENCOUNTER — Ambulatory Visit: Payer: Self-pay | Admitting: *Deleted

## 2020-10-15 DIAGNOSIS — R059 Cough, unspecified: Secondary | ICD-10-CM | POA: Diagnosis not present

## 2020-10-15 DIAGNOSIS — R5383 Other fatigue: Secondary | ICD-10-CM | POA: Diagnosis not present

## 2020-10-15 DIAGNOSIS — R52 Pain, unspecified: Secondary | ICD-10-CM | POA: Diagnosis not present

## 2020-10-15 DIAGNOSIS — J069 Acute upper respiratory infection, unspecified: Secondary | ICD-10-CM | POA: Diagnosis not present

## 2020-10-15 DIAGNOSIS — Z20822 Contact with and (suspected) exposure to covid-19: Secondary | ICD-10-CM | POA: Diagnosis not present

## 2020-10-15 NOTE — Telephone Encounter (Signed)
Reason for Disposition  MILD-MODERATE diarrhea (e.g., 1-6 times / day more than normal)  Answer Assessment - Initial Assessment Questions 1. DIARRHEA SEVERITY: "How bad is the diarrhea?" "How many more stools have you had in the past 24 hours than normal?"    - NO DIARRHEA (SCALE 0)   - MILD (SCALE 1-3): Few loose or mushy BMs; increase of 1-3 stools over normal daily number of stools; mild increase in ostomy output.   -  MODERATE (SCALE 4-7): Increase of 4-6 stools daily over normal; moderate increase in ostomy output. * SEVERE (SCALE 8-10; OR 'WORST POSSIBLE'): Increase of 7 or more stools daily over normal; moderate increase in ostomy output; incontinence.     3 episodes since 1 am 2. ONSET: "When did the diarrhea begin?"      1am 3. BM CONSISTENCY: "How loose or watery is the diarrhea?"      watery 4. VOMITING: "Are you also vomiting?" If Yes, ask: "How many times in the past 24 hours?"      5 times 5. ABDOMINAL PAIN: "Are you having any abdominal pain?" If Yes, ask: "What does it feel like?" (e.g., crampy, dull, intermittent, constant)      no 6. ABDOMINAL PAIN SEVERITY: If present, ask: "How bad is the pain?"  (e.g., Scale 1-10; mild, moderate, or severe)   - MILD (1-3): doesn't interfere with normal activities, abdomen soft and not tender to touch    - MODERATE (4-7): interferes with normal activities or awakens from sleep, abdomen tender to touch    - SEVERE (8-10): excruciating pain, doubled over, unable to do any normal activities       None 7. ORAL INTAKE: If vomiting, "Have you been able to drink liquids?" "How much liquids have you had in the past 24 hours?"     Yes staying hydrated 8. HYDRATION: "Any signs of dehydration?" (e.g., dry mouth [not just dry lips], too weak to stand, dizziness, new weight loss) "When did you last urinate?"     WNL  10. ANTIBIOTIC USE: "Are you taking antibiotics now or have you taken antibiotics in the past 2 months?"       no 11. OTHER  SYMPTOMS: "Do you have any other symptoms?" (e.g., fever, blood in stool)       no  Protocols used: Diarrhea-A-AH

## 2020-10-15 NOTE — Telephone Encounter (Signed)
Pt reports diarrhea, vomiting during night, onset 1am. States has not had either since waking this AM. Reports Friday with cold like symptoms, sore throat, cough. Did home covid test, negative. States vomited 5 times during night, 3 episodes of diarrhea, watery. Has not been on any antibiotics. Denies abdominal pain. States is staying hydrated. Home care advise given. Advised PCR testing, states she will do so. Assured pt NT would route to practice for PCPs review, if anything to add, would CB. Advised UC if symptoms worsens. Pt verbalizes understanding.

## 2020-11-10 ENCOUNTER — Other Ambulatory Visit: Payer: Self-pay | Admitting: Nurse Practitioner

## 2020-11-10 DIAGNOSIS — D649 Anemia, unspecified: Secondary | ICD-10-CM

## 2020-11-10 NOTE — Telephone Encounter (Signed)
Requested medications are due for refill today yes  Requested medications are on the active medication list yes  Last refill 08/15/20  Last visit 06/2020, last Iron level was 10/2018  Future visit scheduled no  Notes to clinic Failed protocol due to no valid lab within 360 days, no upcoming visit scheduled.Please assess.

## 2020-12-01 ENCOUNTER — Other Ambulatory Visit: Payer: Self-pay | Admitting: Nurse Practitioner

## 2020-12-01 DIAGNOSIS — Z20822 Contact with and (suspected) exposure to covid-19: Secondary | ICD-10-CM | POA: Diagnosis not present

## 2020-12-01 DIAGNOSIS — I10 Essential (primary) hypertension: Secondary | ICD-10-CM

## 2020-12-01 DIAGNOSIS — E785 Hyperlipidemia, unspecified: Secondary | ICD-10-CM

## 2020-12-02 NOTE — Telephone Encounter (Signed)
Pt was due to f/u 12/15/20 Attempted to call pt but VM not set up Last RF 10/05/20 #60 for all meds and RF due

## 2020-12-06 ENCOUNTER — Other Ambulatory Visit: Payer: Self-pay | Admitting: Family Medicine

## 2020-12-06 DIAGNOSIS — D649 Anemia, unspecified: Secondary | ICD-10-CM

## 2020-12-06 NOTE — Telephone Encounter (Signed)
Requested medication (s) are due for refill today: Yes  Requested medication (s) are on the active medication list: Yes  Last refill:  11/13/20  Future visit scheduled: No  Notes to clinic:  Pt. Needs OV. Unable to leave pt. Message. Mailbox not set up.    Requested Prescriptions  Pending Prescriptions Disp Refills   ferrous gluconate (FERGON) 324 MG tablet [Pharmacy Med Name: FERROUS GLUCONATE 324 MG TAB] 30 tablet 0    Sig: TAKE 1 TABLET BY MOUTH EVERY DAY WITH BREAKFAST     Endocrinology:  Minerals - Iron Supplementation Failed - 12/06/2020 10:32 AM      Failed - Fe (serum) in normal range and within 360 days    Iron  Date Value Ref Range Status  11/15/2018 78 27 - 139 ug/dL Final   %SAT  Date Value Ref Range Status  06/30/2014 19 (L) 20 - 55 % Final   Iron Saturation  Date Value Ref Range Status  11/15/2018 25 15 - 55 % Final          Failed - Ferritin in normal range and within 360 days    Ferritin  Date Value Ref Range Status  11/15/2018 73 15 - 150 ng/mL Final          Passed - HGB in normal range and within 360 days    Hemoglobin  Date Value Ref Range Status  06/15/2020 11.6 11.1 - 15.9 g/dL Final          Passed - HCT in normal range and within 360 days    Hematocrit  Date Value Ref Range Status  06/15/2020 35.7 34.0 - 46.6 % Final          Passed - RBC in normal range and within 360 days    RBC  Date Value Ref Range Status  06/15/2020 4.03 3.77 - 5.28 x10E6/uL Final  06/01/2018 4.04 3.87 - 5.11 Mil/uL Final          Passed - Valid encounter within last 12 months    Recent Outpatient Visits           5 months ago Primary hypertension   [facility] [place], Shea Stakes, NP   11 months ago Need for influenza vaccination   [facility] [place], Cornelius Moras, RPH-CPP   1 year ago Viral conjunctivitis, left eye   [facility] [place], [place], NP   1 year ago  Encounter for Papanicolaou smear for cervical cancer screening    [facility] [place], Shea Stakes, NP   1 year ago Encounter for annual physical exam   [facility] [place], Shea Stakes, NP

## 2020-12-21 ENCOUNTER — Other Ambulatory Visit: Payer: Self-pay | Admitting: Family Medicine

## 2020-12-21 DIAGNOSIS — I10 Essential (primary) hypertension: Secondary | ICD-10-CM

## 2020-12-21 DIAGNOSIS — E785 Hyperlipidemia, unspecified: Secondary | ICD-10-CM

## 2020-12-21 NOTE — Telephone Encounter (Signed)
Courtesy refill. Hyzaar. Last labs 06/15/20. Requested Prescriptions  Pending Prescriptions Disp Refills  . atorvastatin (LIPITOR) 20 MG tablet [Pharmacy Med Name: ATORVASTATIN 20 MG TABLET] 30 tablet 0    Sig: TAKE 1 TABLET BY MOUTH EVERY DAY     Cardiovascular:  Antilipid - Statins Passed - 12/21/2020  2:13 AM      Passed - Total Cholesterol in normal range and within 360 days    Cholesterol, Total  Date Value Ref Range Status  06/15/2020 138 100 - 199 mg/dL Final         Passed - LDL in normal range and within 360 days    LDL Chol Calc (NIH)  Date Value Ref Range Status  06/15/2020 71 0 - 99 mg/dL Final         Passed - HDL in normal range and within 360 days    HDL  Date Value Ref Range Status  06/15/2020 54 >39 mg/dL Final         Passed - Triglycerides in normal range and within 360 days    Triglycerides  Date Value Ref Range Status  06/15/2020 60 0 - 149 mg/dL Final         Passed - Patient is not pregnant      Passed - Valid encounter within last 12 months    Recent Outpatient Visits          6 months ago Primary hypertension   [facility] [place], Shea Stakes, NP   11 months ago Need for influenza vaccination   [facility] [place], Cornelius Moras, RPH-CPP   1 year ago Viral conjunctivitis, left eye   [facility] [place], [place], NP   1 year ago Encounter for Papanicolaou smear for cervical cancer screening   [facility] [place], Shea Stakes, NP   1 year ago Encounter for annual physical exam   [facility] [place], Shea Stakes, NP      Future Appointments            In 1 month Claiborne Rigg, NP [facility]           . losartan-hydrochlorothiazide (HYZAAR) 100-25 MG tablet [Pharmacy Med Name: LOSARTAN-HCTZ 100-25 MG TAB] 30 tablet 0    Sig: TAKE 1 TABLET BY MOUTH EVERY DAY      Cardiovascular: ARB + Diuretic Combos Failed - 12/21/2020  2:13 AM      Failed - K in normal range and within 180 days    Potassium  Date Value Ref Range Status  06/15/2020 4.9 3.5 - 5.2 mmol/L Final         Failed - Na in normal range and within 180 days    Sodium  Date Value Ref Range Status  06/15/2020 144 134 - 144 mmol/L Final         Failed - Cr in normal range and within 180 days    Creat  Date Value Ref Range Status  04/06/2015 1.03 0.50 - 1.05 mg/dL Final   Creatinine, Ser  Date Value Ref Range Status  06/15/2020 1.05 (H) 0.57 - 1.00 mg/dL Final         Failed - Ca in normal range and within 180 days    Calcium  Date Value Ref Range Status  06/15/2020 9.9 8.7 - 10.3 mg/dL Final  Failed - Valid encounter within last 6 months    Recent Outpatient Visits          6 months ago Primary hypertension   [facility] Claiborne Rigg, NP   11 months ago Need for influenza vaccination   [facility] [place]is Hux[place], Cornelius Moras, RPH-CPP   1 year ago Viral conjunctivitis, left eye   [facility] [place], [place], NP   1 year ago Encounter for Papanicolaou smear for cervical cancer screening   [facility] [place], Shea Stakes, NP   1 year ago Encounter for annual physical exam   [facility] Claiborne Rigg, NP      Future Appointments            In 1 month Claiborne Rigg, NP [facility]           Passed - Patient is not pregnant      Passed - Last BP in normal range    BP Readings from Last 1 Encounters:  07/18/20 122/78

## 2020-12-21 NOTE — Telephone Encounter (Signed)
Last labs 06/15/20.

## 2020-12-31 ENCOUNTER — Other Ambulatory Visit: Payer: Self-pay | Admitting: Family Medicine

## 2020-12-31 DIAGNOSIS — I10 Essential (primary) hypertension: Secondary | ICD-10-CM

## 2021-01-01 NOTE — Telephone Encounter (Signed)
Requested medications are due for refill today yes  Requested medications are on the active medication list yes  Last refill 12/03/20  Last visit 06/15/20  Future visit scheduled 02/15/21  Notes to clinic Was asked to return in Oct, no upcoming appt scheduled until Dec, please assess.  Requested Prescriptions  Pending Prescriptions Disp Refills   metoprolol succinate (TOPROL-XL) 50 MG 24 hr tablet [Pharmacy Med Name: METOPROLOL SUCC ER 50 MG TAB] 30 tablet 0    Sig: TAKE 1 TABLET BY MOUTH EVERY DAY TAKE WITH OR IMMEDIATELY FOLLOWING A MEAL     Cardiovascular:  Beta Blockers Failed - 12/31/2020  6:53 PM      Failed - Valid encounter within last 6 months    Recent Outpatient Visits           6 months ago Primary hypertension   [facility] [place], Shea Stakes, NP   11 months ago Need for influenza vaccination   [facility] [place]ux[place], Cornelius Moras, RPH-CPP   1 year ago Viral conjunctivitis, left eye   [facility] [place], [place], NP   1 year ago Encounter for Papanicolaou smear for cervical cancer screening   [facility] [place], Shea Stakes, NP   1 year ago Encounter for annual physical exam   [facility] [place], Shea Stakes, NP       Future Appointments             In 1 month Claiborne Rigg, NP [facility]            Passed - Last BP in normal range    BP Readings from Last 1 Encounters:  07/18/20 122/78          Passed - Last Heart Rate in normal range    Pulse Readings from Last 1 Encounters:  07/18/20 68

## 2021-01-07 ENCOUNTER — Other Ambulatory Visit: Payer: Self-pay | Admitting: Family Medicine

## 2021-01-07 DIAGNOSIS — D649 Anemia, unspecified: Secondary | ICD-10-CM

## 2021-01-07 NOTE — Telephone Encounter (Signed)
Requested medication (s) are due for refill today:   Yes  Requested medication (s) are on the active medication list:   Yes  Future visit scheduled:   Yes 02/15/2021 with [name]   Last ordered: 12/07/2020 #30, 0 refills  Returned because office visit required for further refills per note.   30 day courtesy refill has been given.   Provider to review for refills prior to appt.    Requested Prescriptions  Pending Prescriptions Disp Refills   ferrous gluconate (FERGON) 324 MG tablet [Pharmacy Med Name: FERROUS GLUCONATE 324 MG TAB] 30 tablet 0    Sig: TAKE 1 TABLET BY MOUTH EVERY DAY WITH BREAKFAST     Endocrinology:  Minerals - Iron Supplementation Failed - 01/07/2021  9:32 AM      Failed - Fe (serum) in normal range and within 360 days    Iron  Date Value Ref Range Status  11/15/2018 78 27 - 139 ug/dL Final   %SAT  Date Value Ref Range Status  06/30/2014 19 (L) 20 - 55 % Final   Iron Saturation  Date Value Ref Range Status  11/15/2018 25 15 - 55 % Final          Failed - Ferritin in normal range and within 360 days    Ferritin  Date Value Ref Range Status  11/15/2018 73 15 - 150 ng/mL Final          Passed - HGB in normal range and within 360 days    Hemoglobin  Date Value Ref Range Status  06/15/2020 11.6 11.1 - 15.9 g/dL Final          Passed - HCT in normal range and within 360 days    Hematocrit  Date Value Ref Range Status  06/15/2020 35.7 34.0 - 46.6 % Final          Passed - RBC in normal range and within 360 days    RBC  Date Value Ref Range Status  06/15/2020 4.03 3.77 - 5.28 x10E6/uL Final  06/01/2018 4.04 3.87 - 5.11 Mil/uL Final          Passed - Valid encounter within last 12 months    Recent Outpatient Visits           6 months ago Primary hypertension   [facility] [place], Shea Stakes, NP   12 months ago Need for influenza vaccination   [facility] Lois Huxley, Cornelius Moras, RPH-CPP    1 year ago Viral conjunctivitis, left eye   [facility] [place], [place], NP   1 year ago Encounter for Papanicolaou smear for cervical cancer screening   [facility] [place], Shea Stakes, NP   1 year ago Encounter for annual physical exam   [facility] [place], Shea Stakes, NP       Future Appointments             In 1 month Claiborne Rigg, NP [facility]

## 2021-01-18 ENCOUNTER — Ambulatory Visit: Payer: Self-pay

## 2021-01-21 ENCOUNTER — Other Ambulatory Visit: Payer: Self-pay | Admitting: Family Medicine

## 2021-01-21 DIAGNOSIS — D649 Anemia, unspecified: Secondary | ICD-10-CM

## 2021-01-21 NOTE — Telephone Encounter (Signed)
Requested medication (s) are due for refill today  Last ordered 01/07/21 #30 tabs with one refill.   Requested medication (s) are on the active medication list yes  Future visit scheduled Yes, 02/15/21.  Labs do not meet protocol, most recent Fe and Ferritin on 10/18/18. Routing to provider to review for requested changes.    Requested Prescriptions  Pending Prescriptions Disp Refills   ferrous gluconate (FERGON) 324 MG tablet [Pharmacy Med Name: FERROUS GLUCONATE 324 MG TAB] 90 tablet 1    Sig: TAKE 1 TABLET BY MOUTH EVERY DAY WITH BREAKFAST     Endocrinology:  Minerals - Iron Supplementation Failed - 01/21/2021 11:52 AM      Failed - Fe (serum) in normal range and within 360 days    Iron  Date Value Ref Range Status  11/15/2018 78 27 - 139 ug/dL Final   %SAT  Date Value Ref Range Status  06/30/2014 19 (L) 20 - 55 % Final   Iron Saturation  Date Value Ref Range Status  11/15/2018 25 15 - 55 % Final          Failed - Ferritin in normal range and within 360 days    Ferritin  Date Value Ref Range Status  11/15/2018 73 15 - 150 ng/mL Final          Passed - HGB in normal range and within 360 days    Hemoglobin  Date Value Ref Range Status  06/15/2020 11.6 11.1 - 15.9 g/dL Final          Passed - HCT in normal range and within 360 days    Hematocrit  Date Value Ref Range Status  06/15/2020 35.7 34.0 - 46.6 % Final          Passed - RBC in normal range and within 360 days    RBC  Date Value Ref Range Status  06/15/2020 4.03 3.77 - 5.28 x10E6/uL Final  06/01/2018 4.04 3.87 - 5.11 Mil/uL Final          Passed - Valid encounter within last 12 months    Recent Outpatient Visits           7 months ago Primary hypertension   [facility] [place], Shea Stakes, NP   1 year ago Need for influenza vaccination   [facility] Lois Hux[place], Cornelius Moras, RPH-CPP   1 year ago Viral conjunctivitis, left eye   [place]  [facility] [place], [place], NP   1 year ago Encounter for Papanicolaou smear for cervical cancer screening   [facility] [place], Shea Stakes, NP   1 year ago Encounter for annual physical exam   [facility] [place], Shea Stakes, NP       Future Appointments             In 3 weeks Claiborne Rigg, NP [employer] [facility]

## 2021-01-25 ENCOUNTER — Other Ambulatory Visit: Payer: Self-pay | Admitting: Family Medicine

## 2021-01-25 DIAGNOSIS — E785 Hyperlipidemia, unspecified: Secondary | ICD-10-CM

## 2021-01-25 DIAGNOSIS — I10 Essential (primary) hypertension: Secondary | ICD-10-CM

## 2021-01-26 NOTE — Telephone Encounter (Signed)
Requested Prescriptions  Pending Prescriptions Disp Refills  . atorvastatin (LIPITOR) 20 MG tablet [Pharmacy Med Name: ATORVASTATIN 20 MG TABLET] 30 tablet 3    Sig: TAKE 1 TABLET BY MOUTH EVERY DAY     Cardiovascular:  Antilipid - Statins Passed - 01/25/2021 12:33 PM      Passed - Total Cholesterol in normal range and within 360 days    Cholesterol, Total  Date Value Ref Range Status  06/15/2020 138 100 - 199 mg/dL Final         Passed - LDL in normal range and within 360 days    LDL Chol Calc (NIH)  Date Value Ref Range Status  06/15/2020 71 0 - 99 mg/dL Final         Passed - HDL in normal range and within 360 days    HDL  Date Value Ref Range Status  06/15/2020 54 >39 mg/dL Final         Passed - Triglycerides in normal range and within 360 days    Triglycerides  Date Value Ref Range Status  06/15/2020 60 0 - 149 mg/dL Final         Passed - Patient is not pregnant      Passed - Valid encounter within last 12 months    Recent Outpatient Visits          7 months ago Primary hypertension   [facility] [place], Shea Stakes, NP   1 year ago Need for influenza vaccination   [facility] Lois Hux[place], Cornelius Moras, RPH-CPP   1 year ago Viral conjunctivitis, left eye   [place] [place], [place], NP   1 year ago Encounter for Papanicolaou smear for cervical cancer screening   [facility] [place], Shea Stakes, NP   1 year ago Encounter for annual physical exam   [facility] [place], Shea Stakes, NP      Future Appointments            In 2 weeks Claiborne Rigg, NP [employer] [facility]           . losartan-hydrochlorothiazide (HYZAAR) 100-25 MG tablet [Pharmacy Med Name: LOSARTAN-HCTZ 100-25 MG TAB] 30 tablet 0    Sig: TAKE 1 TABLET BY MOUTH EVERY DAY     Cardiovascular: ARB + Diuretic Combos Failed -  01/25/2021 12:33 PM      Failed - K in normal range and within 180 days    Potassium  Date Value Ref Range Status  06/15/2020 4.9 3.5 - 5.2 mmol/L Final         Failed - Na in normal range and within 180 days    Sodium  Date Value Ref Range Status  06/15/2020 144 134 - 144 mmol/L Final         Failed - Cr in normal range and within 180 days    Creat  Date Value Ref Range Status  04/06/2015 1.03 0.50 - 1.05 mg/dL Final   Creatinine, Ser  Date Value Ref Range Status  06/15/2020 1.05 (H) 0.57 - 1.00 mg/dL Final         Failed - Ca in normal range and within 180 days    Calcium  Date Value Ref Range Status  06/15/2020 9.9 8.7 - 10.3 mg/dL Final         Failed - Valid encounter within last 6  months    Recent Outpatient Visits          7 months ago Primary hypertension   [facility] [place], Shea Stakes, NP   1 year ago Need for influenza vaccination   [facility] [place] H[place], Cornelius Moras, RPH-CPP   1 year ago Viral conjunctivitis, left eye   [place] [facility] [place], [place], NP   1 year ago Encounter for Papanicolaou smear for cervical cancer screening   [facility] [place], Shea Stakes, NP   1 year ago Encounter for annual physical exam   [facility] Claiborne Rigg, NP      Future Appointments            In 2 weeks Claiborne Rigg, NP [facility]           Passed - Patient is not pregnant      Passed - Last BP in normal range    BP Readings from Last 1 Encounters:  07/18/20 122/78

## 2021-02-06 ENCOUNTER — Ambulatory Visit
Admission: RE | Admit: 2021-02-06 | Discharge: 2021-02-06 | Disposition: A | Discharge status: Home / Self Care | Payer: Medicare Other | Source: Ambulatory Visit | Attending: Nurse Practitioner | Admitting: Nurse Practitioner

## 2021-02-06 ENCOUNTER — Other Ambulatory Visit: Payer: Self-pay

## 2021-02-06 DIAGNOSIS — Z1231 Encounter for screening mammogram for malignant neoplasm of breast: Secondary | ICD-10-CM

## 2021-02-15 ENCOUNTER — Encounter: Payer: Self-pay | Admitting: Nurse Practitioner

## 2021-02-15 ENCOUNTER — Other Ambulatory Visit: Payer: Self-pay

## 2021-02-15 ENCOUNTER — Ambulatory Visit: Payer: Medicare Other | Attending: Nurse Practitioner | Admitting: Nurse Practitioner

## 2021-02-15 VITALS — BP 136/78 | HR 89 | Ht 62.0 in | Wt 199.0 lb

## 2021-02-15 DIAGNOSIS — I10 Essential (primary) hypertension: Secondary | ICD-10-CM

## 2021-02-15 DIAGNOSIS — N62 Hypertrophy of breast: Secondary | ICD-10-CM

## 2021-02-15 DIAGNOSIS — Z0000 Encounter for general adult medical examination without abnormal findings: Secondary | ICD-10-CM | POA: Diagnosis not present

## 2021-02-15 DIAGNOSIS — Z862 Personal history of diseases of the blood and blood-forming organs and certain disorders involving the immune mechanism: Secondary | ICD-10-CM

## 2021-02-15 DIAGNOSIS — Z6836 Body mass index (BMI) 36.0-36.9, adult: Secondary | ICD-10-CM

## 2021-02-15 DIAGNOSIS — R7303 Prediabetes: Secondary | ICD-10-CM

## 2021-02-15 DIAGNOSIS — Z23 Encounter for immunization: Secondary | ICD-10-CM

## 2021-02-15 DIAGNOSIS — E785 Hyperlipidemia, unspecified: Secondary | ICD-10-CM

## 2021-02-15 LAB — POCT GLYCOSYLATED HEMOGLOBIN (HGB A1C): Hemoglobin A1C: 6.3 % — AB (ref 4.0–5.6)

## 2021-02-15 LAB — GLUCOSE, POCT (MANUAL RESULT ENTRY): POC Glucose: 153 mg/dl — AB (ref 70–99)

## 2021-02-15 MED ORDER — ATORVASTATIN CALCIUM 20 MG PO TABS: 20.0000 mg | ORAL_TABLET | Freq: Every day | ORAL | 1 refills | Status: DC

## 2021-02-15 MED ORDER — METOPROLOL SUCCINATE ER 50 MG PO TB24: 50.0000 mg | ORAL_TABLET | Freq: Every day | ORAL | 1 refills | Status: DC

## 2021-02-15 MED ORDER — LOSARTAN POTASSIUM-HCTZ 100-25 MG PO TABS: 1.0000 | ORAL_TABLET | Freq: Every day | ORAL | 1 refills | Status: DC

## 2021-02-15 MED ORDER — WEGOVY 0.25 MG/0.5ML ~~LOC~~ SOAJ: 0.2500 mg | SUBCUTANEOUS | 6 refills | Status: DC

## 2021-02-15 NOTE — Progress Notes (Signed)
5th disease

## 2021-02-15 NOTE — Progress Notes (Signed)
Assessment & Plan:  Qunisha was seen today for diabetes.  Diagnoses and all orders for this visit:  Encounter for annual physical exam  Prediabetes -     POCT glycosylated hemoglobin (Hb A1C) -     POCT glucose (manual entry) -     Semaglutide-Weight Management (WEGOVY) 0.25 MG/0.5ML SOAJ; Inject 0.25 mg into the skin once a week. R 73.03; R 768.36  Primary hypertension -     Semaglutide-Weight Management (WEGOVY) 0.25 MG/0.5ML SOAJ; Inject 0.25 mg into the skin once a week. R 73.03; R 768.36 -     losartan-hydrochlorothiazide (HYZAAR) 100-25 MG tablet; Take 1 tablet by mouth daily. -     metoprolol succinate (TOPROL-XL) 50 MG 24 hr tablet; Take 1 tablet (50 mg total) by mouth daily. Take with or immediately following a meal. -     CMP14+EGFR  Dyslipidemia, goal LDL below 100 -     atorvastatin (LIPITOR) 20 MG tablet; Take 1 tablet (20 mg total) by mouth daily. -     Lipid panel  BMI 36.0-36.9,adult -     Semaglutide-Weight Management (WEGOVY) 0.25 MG/0.5ML SOAJ; Inject 0.25 mg into the skin once a week. R 73.03; R 768.36  Large breasts -     Ambulatory referral to Plastic Surgery  History of anemia -     CBC with Differential  Need for influenza vaccination -     Flu Vaccine QUAD 34mo+IM (Fluarix, Fluzone & Alfiuria Quad PF)   Patient has been counseled on age-appropriate routine health concerns for screening and prevention. These are reviewed and up-to-date. Referrals have been placed accordingly. Immunizations are up-to-date or declined.    Subjective:   Chief Complaint  Patient presents with   Diabetes   HPI Paula Alexander 65 y.o. 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   She is requesting a referral to surgeon for breast reduction.  She wears a size 38O Bra. Has difficulty exercising due to body habitus and breasts. Can not run or jog to help lose  weight as this causes breast discomfort due to size. She has chronic neck, back and shoulder pain due to the size of her breasts.   HTN Blood pressure is well controlled.  She is taking Hyzaar 100-25 mg daily as prescribed. BP Readings from Last 3 Encounters:  02/15/21 136/78  07/18/20 122/78  06/15/20 132/70     Prediabetes BM >30. She is interested in weight loss medication. A1c has increased since last visit. Comorbid: HTN Lab Results  Component Value Date   HGBA1C 6.3 (A) 02/15/2021    Review of Systems  Constitutional:  Negative for fever, malaise/fatigue and weight loss.  HENT: Negative.  Negative for nosebleeds.   Eyes: Negative.  Negative for blurred vision, double vision and photophobia.  Respiratory: Negative.  Negative for cough and shortness of breath.   Cardiovascular: Negative.  Negative for chest pain, palpitations and leg swelling.  Gastrointestinal: Negative.  Negative for heartburn, nausea and vomiting.  Genitourinary: Negative.   Musculoskeletal: Negative.  Negative for myalgias.  Skin: Negative.   Neurological: Negative.  Negative for dizziness, focal weakness, seizures and headaches.  Endo/Heme/Allergies: Negative.   Psychiatric/Behavioral: Negative.  Negative for suicidal ideas.    Past Medical History:  Diagnosis Date   Anemia    Calcium deficiency    Heart murmur    Hyperlipidemia    Hypertension  PVD (peripheral vascular disease) ([place])     History reviewed. No pertinent surgical history.  Family History  Problem Relation Age of Onset   Hypertension Mother    Stroke Mother    Breast cancer Mother    Multiple sclerosis Sister    Breast cancer Sister    Diabetes Son    Colon cancer Neg Hx    Esophageal cancer Neg Hx    Inflammatory bowel disease Neg Hx    Liver disease Neg Hx    Pancreatic cancer Neg Hx    Rectal cancer Neg Hx    Stomach cancer Neg Hx     Social History Reviewed with no changes to be made today.   Outpatient  Medications Prior to Visit  Medication Sig Dispense Refill   aspirin EC 81 MG tablet Take 1 tablet (81 mg total) by mouth daily. 90 tablet 3   cetirizine (ZYRTEC ALLERGY) 10 MG tablet Take 1 tablet (10 mg total) by mouth daily. 30 tablet 11   ferrous gluconate (FERGON) 324 MG tablet TAKE 1 TABLET BY MOUTH EVERY DAY WITH BREAKFAST 30 tablet 1   fluticasone (FLONASE) 50 MCG/ACT nasal spray Place 2 sprays into both nostrils daily. 16 g 6   Multiple Vitamins-Minerals (MULTI FOR HER) TABS Take 1 tablet by mouth.     atorvastatin (LIPITOR) 20 MG tablet TAKE 1 TABLET BY MOUTH EVERY DAY 30 tablet 3   losartan-hydrochlorothiazide (HYZAAR) 100-25 MG tablet TAKE 1 TABLET BY MOUTH EVERY DAY 30 tablet 0   metoprolol succinate (TOPROL-XL) 50 MG 24 hr tablet TAKE 1 TABLET BY MOUTH EVERY DAY TAKE WITH OR IMMEDIATELY FOLLOWING A MEAL 90 tablet 0   amoxicillin-clavulanate (AUGMENTIN) 875-125 MG tablet Take 1 tablet by mouth 2 (two) times daily. (Patient not taking: Reported on 02/15/2021) 20 tablet 0   No facility-administered medications prior to visit.    No Known Allergies     Objective:    BP 136/78   Pulse 89   Ht $R'5\' 2"'vn$  (1.575 m)   Wt 199 lb (90.3 kg)   SpO2 98%   BMI 36.40 kg/m  Wt Readings from Last 3 Encounters:  02/15/21 199 lb (90.3 kg)  07/18/20 195 lb (88.5 kg)  06/15/20 203 lb 6.4 oz (92.3 kg)    Physical Exam Constitutional:      Appearance: She is well-developed.  HENT:     Head: Normocephalic and atraumatic.     Right Ear: Hearing, tympanic membrane, ear canal and external ear normal.     Left Ear: Hearing, tympanic membrane, ear canal and external ear normal.     Nose: Nose normal.     Mouth/Throat:     Lips: Pink.     Pharynx: No oropharyngeal exudate.  Eyes:     General: No scleral icterus.       Right eye: No discharge.     Conjunctiva/sclera: Conjunctivae normal.     Pupils: Pupils are equal, round, and reactive to light.  Neck:     Thyroid: No thyromegaly.      Trachea: No tracheal deviation.  Cardiovascular:     Rate and Rhythm: Normal rate and regular rhythm.     Heart sounds: Normal heart sounds. No murmur heard.   No friction rub.  Pulmonary:     Effort: Pulmonary effort is normal. No accessory muscle usage or respiratory distress.     Breath sounds: Normal breath sounds. No decreased breath sounds, wheezing, rhonchi or rales.  Abdominal:  General: Bowel sounds are normal. There is no distension.     Palpations: Abdomen is soft. There is no mass.     Tenderness: There is no abdominal tenderness. There is no guarding or rebound.  Musculoskeletal:        General: No tenderness or deformity. Normal range of motion.     Cervical back: Normal range of motion and neck supple.  Lymphadenopathy:     Cervical: No cervical adenopathy.  Skin:    General: Skin is warm and dry.     Findings: No erythema.  Neurological:     Mental Status: She is alert and oriented to person, place, and time.     Cranial Nerves: No cranial nerve deficit.     Coordination: Coordination normal.     Deep Tendon Reflexes: Reflexes are normal and symmetric.  Psychiatric:        Speech: Speech normal.        Behavior: Behavior normal.        Thought Content: Thought content normal.        Judgment: Judgment normal.         Patient has been counseled extensively about nutrition and exercise as well as the importance of adherence with medications and regular follow-up. The patient was given clear instructions to go to ER or return to medical center if symptoms don't improve, worsen or new problems develop. The patient verbalized understanding.   Follow-up: No follow-ups on file.   Gildardo Pounds, FNP-BC [facility], [place]   02/15/2021, 2:56 PM

## 2021-02-25 ENCOUNTER — Telehealth: Payer: Self-pay | Admitting: Nurse Practitioner

## 2021-02-25 NOTE — Telephone Encounter (Signed)
Paula Alexander is not covered for weight loss despite preDM diagnosis. We could try Saxenda to see if it's covered instead.

## 2021-02-25 NOTE — Telephone Encounter (Signed)
Pt is calling to report that Semaglutide-Weight Management (WEGOVY) 0.25 MG/0.5ML SOAJ [[id number]]  is not covered under insurance  [facility] [employer] [phone]

## 2021-02-26 ENCOUNTER — Other Ambulatory Visit: Payer: Self-pay | Admitting: Nurse Practitioner

## 2021-02-26 ENCOUNTER — Other Ambulatory Visit: Payer: Self-pay

## 2021-02-26 ENCOUNTER — Other Ambulatory Visit: Payer: Self-pay | Admitting: Family Medicine

## 2021-02-26 DIAGNOSIS — R7303 Prediabetes: Secondary | ICD-10-CM

## 2021-02-26 DIAGNOSIS — E669 Obesity, unspecified: Secondary | ICD-10-CM

## 2021-02-26 DIAGNOSIS — I10 Essential (primary) hypertension: Secondary | ICD-10-CM

## 2021-02-26 DIAGNOSIS — D649 Anemia, unspecified: Secondary | ICD-10-CM

## 2021-02-26 MED ORDER — SAXENDA 18 MG/3ML ~~LOC~~ SOPN
0.6000 mg | PEN_INJECTOR | Freq: Every day | SUBCUTANEOUS | 6 refills | Status: DC
  Filled 2021-02-26: qty 0.6, 6d supply, fill #0

## 2021-02-26 NOTE — Telephone Encounter (Signed)
Requested medications are due for refill today.  yes  Requested medications are on the active medications list.  yes  Last refill. 01/07/2021  Future visit scheduled.   No  Notes to clinic.  Failed protocol d/t expired labs.    Requested Prescriptions  Pending Prescriptions Disp Refills   ferrous gluconate (FERGON) 324 MG tablet [Pharmacy Med Name: FERROUS GLUCONATE 324 MG TAB] 30 tablet 1    Sig: TAKE 1 TABLET BY MOUTH EVERY DAY WITH BREAKFAST     Endocrinology:  Minerals - Iron Supplementation Failed - 02/26/2021  2:33 PM      Failed - Fe (serum) in normal range and within 360 days    Iron  Date Value Ref Range Status  11/15/2018 78 27 - 139 ug/dL Final   %SAT  Date Value Ref Range Status  06/30/2014 19 (L) 20 - 55 % Final   Iron Saturation  Date Value Ref Range Status  11/15/2018 25 15 - 55 % Final          Failed - Ferritin in normal range and within 360 days    Ferritin  Date Value Ref Range Status  11/15/2018 73 15 - 150 ng/mL Final          Passed - HGB in normal range and within 360 days    Hemoglobin  Date Value Ref Range Status  06/15/2020 11.6 11.1 - 15.9 g/dL Final          Passed - HCT in normal range and within 360 days    Hematocrit  Date Value Ref Range Status  06/15/2020 35.7 34.0 - 46.6 % Final          Passed - RBC in normal range and within 360 days    RBC  Date Value Ref Range Status  06/15/2020 4.03 3.77 - 5.28 x10E6/uL Final  06/01/2018 4.04 3.87 - 5.11 Mil/uL Final          Passed - Valid encounter within last 12 months    Recent Outpatient Visits           1 week ago Encounter for annual physical exam   [place] [facility] [place], Shea Stakes, NP   8 months ago Primary hypertension   [place] [facility] [place], Shea Stakes, NP   1 year ago Need for influenza vaccination   [facility] [place], Cornelius Moras, RPH-CPP   1 year ago Viral conjunctivitis,  left eye   [place] [facility] [place], [place], NP   1 year ago Encounter for Papanicolaou smear for cervical cancer screening   [facility] [place], Shea Stakes, NP

## 2021-02-27 ENCOUNTER — Other Ambulatory Visit: Payer: Self-pay

## 2021-04-05 ENCOUNTER — Institutional Professional Consult (permissible substitution): Payer: Medicare Other | Admitting: Plastic Surgery

## 2021-05-07 ENCOUNTER — Ambulatory Visit: Payer: Self-pay | Admitting: *Deleted

## 2021-05-07 NOTE — Telephone Encounter (Signed)
Pt  has called back x 2. Advised per earlier triage message sent to practice. Offered virtual appt via [employer]. "Would like to see [name]. If she can't see me tomorrow I'll go to UC." Assured pt Nt would route to practice for PCPs review.

## 2021-05-07 NOTE — Telephone Encounter (Signed)
I returned pt's call.   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   She is now having symptoms.   Seeking advice.  Left a voicemail to call us back.

## 2021-05-07 NOTE — Telephone Encounter (Signed)
° ° °  Chief Complaint: Cough, congestion Symptoms: Runny nose, wheezing Frequency: Started the weekend Pertinent Negatives: Patient denies fever Disposition: [] ED /[] Urgent Care (no appt availability in office) / [] Appointment(In office/virtual)/ []  [place] [facility]/ [] Home Care/ [] Refused Recommended Disposition /[] [place] [facility]/ []  Follow-up with PCP Additional Notes: Pt. Asking to be worked in after 2:00 due to work schedule. Please advise pt.  Answer Assessment - Initial Assessment Questions 1. ONSET: "When did the cough begin?"      Weekend 2. SEVERITY: "How bad is the cough today?"      Severe 3. SPUTUM: "Describe the color of your sputum" (none, dry cough; clear, white, yellow, green)     None 4. HEMOPTYSIS: "Are you coughing up any blood?" If so ask: "How much?" (flecks, streaks, tablespoons, etc.)     No 5. DIFFICULTY BREATHING: "Are you having difficulty breathing?" If Yes, ask: "How bad is it?" (e.g., mild, moderate, severe)    - MILD: No SOB at rest, mild SOB with walking, speaks normally in sentences, can lie down, no retractions, pulse < 100.    - MODERATE: SOB at rest, SOB with minimal exertion and prefers to sit, cannot lie down flat, speaks in phrases, mild retractions, audible wheezing, pulse 100-120.    - SEVERE: Very SOB at rest, speaks in single words, struggling to breathe, sitting hunched forward, retractions, pulse > 120      No 6. FEVER: "Do you have a fever?" If Yes, ask: "What is your temperature, how was it measured, and when did it start?"     No 7. CARDIAC HISTORY: "Do you have any history of heart disease?" (e.g., heart attack, congestive heart failure)      No 8. LUNG HISTORY: "Do you have any history of lung disease?"  (e.g., pulmonary embolus, asthma, emphysema)     No 9. PE RISK FACTORS: "Do you have a history of blood clots?" (or: recent major surgery, recent prolonged travel, bedridden)     No 10. OTHER SYMPTOMS: "Do you have  any other symptoms?" (e.g., runny nose, wheezing, chest pain)       Wheezing, runny nose 11. PREGNANCY: "Is there any chance you are pregnant?" "When was your last menstrual period?"       No 12. TRAVEL: "Have you traveled out of the country in the last month?" (e.g., travel history, exposures)       No  Protocols used: Cough - Acute Non-Productive-A-AH

## 2021-05-08 ENCOUNTER — Encounter: Payer: Self-pay | Admitting: *Deleted

## 2021-05-08 DIAGNOSIS — J3489 Other specified disorders of nose and nasal sinuses: Secondary | ICD-10-CM | POA: Diagnosis not present

## 2021-05-08 DIAGNOSIS — R059 Cough, unspecified: Secondary | ICD-10-CM | POA: Diagnosis not present

## 2021-05-08 DIAGNOSIS — Z20822 Contact with and (suspected) exposure to covid-19: Secondary | ICD-10-CM | POA: Diagnosis not present

## 2021-05-08 DIAGNOSIS — J40 Bronchitis, not specified as acute or chronic: Secondary | ICD-10-CM | POA: Diagnosis not present

## 2021-05-08 DIAGNOSIS — R0981 Nasal congestion: Secondary | ICD-10-CM | POA: Diagnosis not present

## 2021-05-08 NOTE — Telephone Encounter (Signed)
Patient called back to see if any avaialble appt today with any provider. Earliest noted 05/30/21. Called [facility] no answer. Due to symptoms reported 05/07/21 recommended UC / [facility] walkin clinic. Offered to set up patient with VV and patient declined. Reports she does not want VV on face to face visit. Patient reports she will go to [facility] walkin clinic . Requesting Z. Raul Del, NP be notified.

## 2021-05-08 NOTE — Telephone Encounter (Signed)
This encounter was created in error - please disregard.

## 2021-05-30 ENCOUNTER — Telehealth (INDEPENDENT_AMBULATORY_CARE_PROVIDER_SITE_OTHER): Payer: Self-pay | Admitting: Nurse Practitioner

## 2021-05-30 NOTE — Telephone Encounter (Signed)
Copied from CRM [id number]. Topic: General - Other >> May 29, 2021  4:50 PM Gaetana Michaelis A wrote: Reason for CRM: The patient would like to speak with a member of staff when possible  The patient was recently prescribed Wegovy but shares that the prescription is not approved by their insurance provider   The patient would like to be prescribed an alternative when possible   The patient was told that the medication would be covered in order to help with their A1C  The patient shares that they'll also be having a consultation on 06/07/21 regarding a breast reduction and would like to coordinate their care prior to that date   Please contact further when available

## 2021-05-31 ENCOUNTER — Other Ambulatory Visit: Payer: Self-pay | Admitting: Nurse Practitioner

## 2021-05-31 DIAGNOSIS — E669 Obesity, unspecified: Secondary | ICD-10-CM

## 2021-05-31 DIAGNOSIS — R7303 Prediabetes: Secondary | ICD-10-CM

## 2021-05-31 DIAGNOSIS — I10 Essential (primary) hypertension: Secondary | ICD-10-CM

## 2021-05-31 MED ORDER — SAXENDA 18 MG/3ML ~~LOC~~ SOPN: 0.6000 mg | PEN_INJECTOR | Freq: Every day | SUBCUTANEOUS | 6 refills | Status: DC

## 2021-06-01 NOTE — Telephone Encounter (Signed)
I have spoken to patient. She does not qualify for wegovy or saxenda. She is not diabetic ?

## 2021-06-04 ENCOUNTER — Institutional Professional Consult (permissible substitution): Payer: Medicare Other | Admitting: Plastic Surgery

## 2021-06-07 ENCOUNTER — Institutional Professional Consult (permissible substitution): Payer: Medicare Other | Admitting: Plastic Surgery

## 2021-06-20 ENCOUNTER — Other Ambulatory Visit: Payer: Self-pay | Admitting: Nurse Practitioner

## 2021-06-20 DIAGNOSIS — J0111 Acute recurrent frontal sinusitis: Secondary | ICD-10-CM

## 2021-06-20 NOTE — Telephone Encounter (Signed)
Requested medication (s) are due for refill today: yes ? ?Requested medication (s) are on the active medication list: yes ? ?Last refill:  07/18/20 #30 11 RF ? ?Future visit scheduled: no ? ?Notes to clinic:  needs lab work (creatinine) ? ? ?Requested Prescriptions  ?Pending Prescriptions Disp Refills  ? cetirizine (ZYRTEC) 10 MG tablet [Pharmacy Med Name: CETIRIZINE HCL 10 MG TABLET] 90 tablet 3  ?  Sig: TAKE 1 TABLET BY MOUTH EVERY DAY  ?  ? Ear, Nose, and Throat:  Antihistamines 2 Failed - 06/20/2021  2:41 PM  ?  ?  Failed - Cr in normal range and within 360 days  ?  Creat  ?Date Value Ref Range Status  ?04/06/2015 1.03 0.50 - 1.05 mg/dL Final  ? ?Creatinine, Ser  ?Date Value Ref Range Status  ?06/15/2020 1.05 (H) 0.57 - 1.00 mg/dL Final  ?  ?  ?  ?  Passed - Valid encounter within last 12 months  ?  Recent Outpatient Visits   ? ?      ? 4 months ago Encounter for annual physical exam  ? [place], [place], NP  ? 1 year ago Primary hypertension  ? [place], Vernia Buff, NP  ? 1 year ago Need for influenza vaccination  ? [place], RPH-CPP  ? 1 year ago Viral conjunctivitis, left eye  ? [place], NP  ? 2 years ago Encounter for Papanicolaou smear for cervical cancer screening  ? [place], Vernia Buff, NP  ? ?  ?  ? ?  ?  ?  ? ? ? ? ?

## 2021-06-22 ENCOUNTER — Other Ambulatory Visit: Payer: Self-pay | Admitting: Physician Assistant

## 2021-06-22 DIAGNOSIS — J0111 Acute recurrent frontal sinusitis: Secondary | ICD-10-CM

## 2021-07-21 ENCOUNTER — Other Ambulatory Visit: Payer: Self-pay | Admitting: Physician Assistant

## 2021-07-21 DIAGNOSIS — J0111 Acute recurrent frontal sinusitis: Secondary | ICD-10-CM

## 2021-08-25 ENCOUNTER — Other Ambulatory Visit: Payer: Self-pay | Admitting: Family Medicine

## 2021-08-25 DIAGNOSIS — D649 Anemia, unspecified: Secondary | ICD-10-CM

## 2021-09-18 ENCOUNTER — Other Ambulatory Visit: Payer: Self-pay | Admitting: Family Medicine

## 2021-09-18 DIAGNOSIS — J0111 Acute recurrent frontal sinusitis: Secondary | ICD-10-CM

## 2021-09-18 NOTE — Telephone Encounter (Signed)
Requested medication (s) are due for refill today: Yes  Requested medication (s) are on the active medication list: yes  Last refill:  06/20/21  Future visit scheduled: No - Left message to call and make appointment.  Notes to clinic:  Due OV.    Requested Prescriptions  Pending Prescriptions Disp Refills   cetirizine (ZYRTEC) 10 MG tablet [Pharmacy Med Name: CETIRIZINE HCL 10 MG TABLET] 90 tablet 0    Sig: *NEED OFFICE VISIT* TAKE 1 TABLET EVERY DAY     Ear, Nose, and Throat:  Antihistamines 2 Failed - 09/18/2021  1:45 AM      Failed - Cr in normal range and within 360 days    Creat  Date Value Ref Range Status  04/06/2015 1.03 0.50 - 1.05 mg/dL Final   Creatinine, Ser  Date Value Ref Range Status  06/15/2020 1.05 (H) 0.57 - 1.00 mg/dL Final         Passed - Valid encounter within last 12 months    Recent Outpatient Visits           7 months ago Encounter for annual physical exam   [facility] [place], Shea Stakes, NP   1 year ago Primary hypertension   [facility] [place], Shea Stakes, NP   1 year ago Need for influenza vaccination   [facility] [place]ux[place], Cornelius Moras, RPH-CPP   1 year ago Viral conjunctivitis, left eye   [facility] [place], [place], NP   2 years ago Encounter for Papanicolaou smear for cervical cancer screening   [facility] [place], Shea Stakes, NP

## 2021-09-23 ENCOUNTER — Other Ambulatory Visit: Payer: Self-pay | Admitting: Physician Assistant

## 2021-09-23 DIAGNOSIS — J0111 Acute recurrent frontal sinusitis: Secondary | ICD-10-CM

## 2021-10-15 ENCOUNTER — Other Ambulatory Visit: Payer: Self-pay | Admitting: Family Medicine

## 2021-10-15 DIAGNOSIS — J0111 Acute recurrent frontal sinusitis: Secondary | ICD-10-CM

## 2021-11-14 ENCOUNTER — Other Ambulatory Visit: Payer: Self-pay | Admitting: Physician Assistant

## 2021-11-14 ENCOUNTER — Other Ambulatory Visit: Payer: Self-pay | Admitting: Nurse Practitioner

## 2021-11-14 DIAGNOSIS — E785 Hyperlipidemia, unspecified: Secondary | ICD-10-CM

## 2021-11-14 DIAGNOSIS — J0111 Acute recurrent frontal sinusitis: Secondary | ICD-10-CM

## 2021-11-14 DIAGNOSIS — I10 Essential (primary) hypertension: Secondary | ICD-10-CM

## 2021-11-14 DIAGNOSIS — D649 Anemia, unspecified: Secondary | ICD-10-CM

## 2021-11-14 NOTE — Telephone Encounter (Signed)
Copied from CRM [id number]. Topic: General - Other >> Nov 14, 2021  4:30 PM Everette C wrote: Reason for CRM: Medication Refill - Medication: losartan-hydrochlorothiazide (HYZAAR) 100-25 MG tablet [[id number]]   ferrous gluconate (FERGON) 324 MG tablet [[id number]]   atorvastatin (LIPITOR) 20 MG tablet [[id number]]  cetirizine (ZYRTEC) 10 MG tablet [[id number]]   metoprolol succinate (TOPROL-XL) 50 MG 24 hr tablet [[id number]]  fluticasone (FLONASE) 50 MCG/ACT nasal spray [[id number]]   Multiple Vitamins-Minerals (MULTI FOR HER) TABS [[id number]]   Has the patient contacted their pharmacy? Yes.  The patient will be traveling in the morning 11/15/21 (Agent: If no, request that the patient contact the pharmacy for the refill. If patient does not wish to contact the pharmacy document the reason why and proceed with request.) (Agent: If yes, when and what did the pharmacy advise?)  Preferred Pharmacy (with phone number or street name): [phone] [place] - [address] [place] [address] Phone: [phone] Fax: [phone] Hours: Not open 24 hours   Has the patient been seen for an appointment in the last year OR does the patient have an upcoming appointment? Yes.    Agent: Please be advised that RX refills may take up to 3 business days. We ask that you follow-up with your pharmacy.

## 2021-11-15 ENCOUNTER — Ambulatory Visit: Payer: Medicare Other

## 2021-11-15 NOTE — Telephone Encounter (Signed)
Patient has future OV scheduled but no labs since 06/2020. Will have to forward to PCP.

## 2021-11-15 NOTE — Telephone Encounter (Signed)
Requested medication (s) are due for refill today:yes  Requested medication (s) are on the active medication list: yes  Last refill:    Future visit scheduled: yes  Notes to clinic:  Unable to refill per protocol, appointment needed.  Pt has future OV scheduled, routing for approval.     Requested Prescriptions  Pending Prescriptions Disp Refills   losartan-hydrochlorothiazide (HYZAAR) 100-25 MG tablet 90 tablet 1    Sig: Take 1 tablet by mouth daily.     Cardiovascular: ARB + Diuretic Combos Failed - 11/14/2021  4:53 PM      Failed - K in normal range and within 180 days    Potassium  Date Value Ref Range Status  06/15/2020 4.9 3.5 - 5.2 mmol/L Final         Failed - Na in normal range and within 180 days    Sodium  Date Value Ref Range Status  06/15/2020 144 134 - 144 mmol/L Final         Failed - Cr in normal range and within 180 days    Creat  Date Value Ref Range Status  04/06/2015 1.03 0.50 - 1.05 mg/dL Final   Creatinine, Ser  Date Value Ref Range Status  06/15/2020 1.05 (H) 0.57 - 1.00 mg/dL Final         Failed - eGFR is 10 or above and within 180 days    GFR, Est African American  Date Value Ref Range Status  04/06/2015 69 >=60 mL/min Final   GFR calc Af Amer  Date Value Ref Range Status  04/04/2019 66 >59 mL/min/1.73 Final   GFR, Est Non African American  Date Value Ref Range Status  04/06/2015 60 >=60 mL/min Final    Comment:      The estimated GFR is a calculation valid for adults (>=62 years old) that uses the CKD-EPI algorithm to adjust for age and sex. It is   not to be used for children, pregnant women, hospitalized patients,    patients on dialysis, or with rapidly changing kidney function. According to the NKDEP, eGFR >89 is normal, 60-89 shows mild impairment, 30-59 shows moderate impairment, 15-29 shows severe impairment and <15 is ESRD.      GFR calc non Af Amer  Date Value Ref Range Status  04/04/2019 57 (L) >59 mL/min/1.73  Final   eGFR  Date Value Ref Range Status  06/15/2020 59 (L) >59 mL/min/1.73 Final         Failed - Valid encounter within last 6 months    Recent Outpatient Visits           9 months ago Encounter for annual physical exam   [place], Vernia Buff, NP   1 year ago Primary hypertension   [place], Vernia Buff, NP   1 year ago Need for influenza vaccination   [place], RPH-CPP   2 years ago Viral conjunctivitis, left eye   [place], NP   2 years ago Encounter for Papanicolaou smear for cervical cancer screening   [place], [name] W, NP       Future Appointments             In 1 month Gildardo Pounds, NP [place]  Passed - Patient is not pregnant      Passed - Last BP in normal range    BP Readings from Last 1 Encounters:  02/15/21 136/78          atorvastatin (LIPITOR) 20 MG tablet 90 tablet 1    Sig: Take 1 tablet (20 mg total) by mouth daily.     Cardiovascular:  Antilipid - Statins Failed - 11/14/2021  4:53 PM      Failed - Lipid Panel in normal range within the last 12 months    Cholesterol, Total  Date Value Ref Range Status  06/15/2020 138 100 - 199 mg/dL Final   LDL Chol Calc (NIH)  Date Value Ref Range Status  06/15/2020 71 0 - 99 mg/dL Final   HDL  Date Value Ref Range Status  06/15/2020 54 >39 mg/dL Final   Triglycerides  Date Value Ref Range Status  06/15/2020 60 0 - 149 mg/dL Final         Passed - Patient is not pregnant      Passed - Valid encounter within last 12 months    Recent Outpatient Visits           9 months ago Encounter for annual physical exam   [place], [place] W, NP   1 year ago Primary hypertension   [place], [place] W, NP   1 year ago Need for influenza vaccination   [place], RPH-CPP   2 years ago Viral conjunctivitis, left eye   [place], [place] J, NP   2 years ago Encounter for Papanicolaou smear for cervical cancer screening   [place], Vernia Buff, NP       Future Appointments             In 1 month Gildardo Pounds, NP [place]             ferrous gluconate (FERGON) 324 MG tablet 30 tablet 0     Endocrinology:  Minerals - Iron Supplementation Failed - 11/14/2021  4:53 PM      Failed - HGB in normal range and within 360 days    Hemoglobin  Date Value Ref Range Status  06/15/2020 11.6 11.1 - 15.9 g/dL Final         Failed - HCT in normal range and within 360 days    Hematocrit  Date Value Ref Range Status  06/15/2020 35.7 34.0 - 46.6 % Final         Failed - RBC in normal range and within 360 days    RBC  Date Value Ref Range Status  06/15/2020 4.03 3.77 - 5.28 x10E6/uL Final  06/01/2018 4.04 3.87 - 5.11 Mil/uL Final         Failed - Fe (serum) in normal range and within 360 days    Iron  Date Value Ref Range Status  11/15/2018 78 27 - 139 ug/dL Final   %SAT  Date Value Ref Range Status  06/30/2014 19 (L) 20 - 55 % Final   Iron Saturation  Date Value Ref Range Status  11/15/2018 25 15 - 55 % Final         Failed - Ferritin in normal range and within 360 days    Ferritin  Date Value Ref Range Status  11/15/2018  73 15 - 150 ng/mL Final         Passed - Valid encounter within last 12 months    Recent Outpatient Visits           9 months ago Encounter for annual physical exam   [place], Vernia Buff, NP   1 year ago Primary hypertension   [place], Vernia Buff, NP   1 year ago Need for  influenza vaccination   [place], RPH-CPP   2 years ago Viral conjunctivitis, left eye   [place], [place], NP   2 years ago Encounter for Papanicolaou smear for cervical cancer screening   [place], [name] W, NP       Future Appointments             In 1 month Gildardo Pounds, NP [place]             fluticasone (FLONASE) 50 MCG/ACT nasal spray 16 g 6    Sig: Place 2 sprays into both nostrils daily.     Ear, Nose, and Throat: Nasal Preparations - Corticosteroids Passed - 11/14/2021  4:53 PM      Passed - Valid encounter within last 12 months    Recent Outpatient Visits           9 months ago Encounter for annual physical exam   [place], Vernia Buff, NP   1 year ago Primary hypertension   [place], Vernia Buff, NP   1 year ago Need for influenza vaccination   [place], RPH-CPP   2 years ago Viral conjunctivitis, left eye   [place], [place], NP   2 years ago Encounter for Papanicolaou smear for cervical cancer screening   [place], Vernia Buff, NP       Future Appointments             In 1 month Gildardo Pounds, NP [place]             cetirizine (ZYRTEC) 10 MG tablet 90 tablet 0     Ear, Nose, and Throat:  Antihistamines 2 Failed - 11/14/2021  4:53 PM      Failed - Cr in normal range and within 360 days    Creat  Date Value Ref Range Status  04/06/2015 1.03 0.50 - 1.05 mg/dL Final   Creatinine, Ser  Date Value Ref Range Status  06/15/2020 1.05 (H) 0.57 - 1.00 mg/dL Final         Passed - Valid encounter within last 12 months    Recent Outpatient  Visits           9 months ago Encounter for annual physical exam   [place], Vernia Buff, NP   1 year ago Primary hypertension   [place], Vernia Buff, NP   1 year ago Need for influenza vaccination   [place], RPH-CPP   2 years ago Viral conjunctivitis, left eye   [place], [place]  J, NP   2 years ago Encounter for Papanicolaou smear for cervical cancer screening   [place], [name] W, NP       Future Appointments             In 1 month Gildardo Pounds, NP [place]             Multiple Vitamins-Minerals (MULTI FOR HER) TABS      Sig: Take 1 tablet by mouth.     There is no refill protocol information for this order     metoprolol succinate (TOPROL-XL) 50 MG 24 hr tablet 90 tablet 1    Sig: Take 1 tablet (50 mg total) by mouth daily. Take with or immediately following a meal.     Cardiovascular:  Beta Blockers Failed - 11/14/2021  4:53 PM      Failed - Valid encounter within last 6 months    Recent Outpatient Visits           9 months ago Encounter for annual physical exam   [place], Vernia Buff, NP   1 year ago Primary hypertension   [place], Vernia Buff, NP   1 year ago Need for influenza vaccination   [place], [place], RPH-CPP   2 years ago Viral conjunctivitis, left eye   [place], [place], NP   2 years ago Encounter for Papanicolaou smear for cervical cancer screening   [place], [name] W, NP       Future Appointments             In 1 month Gildardo Pounds, NP [place] BP in normal range    BP Readings from Last 1 Encounters:  02/15/21 136/78         Passed - Last Heart Rate in normal range    Pulse Readings from Last 1 Encounters:  02/15/21 89

## 2021-12-05 ENCOUNTER — Telehealth: Payer: Self-pay | Admitting: Nurse Practitioner

## 2021-12-05 DIAGNOSIS — I10 Essential (primary) hypertension: Secondary | ICD-10-CM

## 2021-12-05 DIAGNOSIS — D649 Anemia, unspecified: Secondary | ICD-10-CM

## 2021-12-05 DIAGNOSIS — E785 Hyperlipidemia, unspecified: Secondary | ICD-10-CM

## 2021-12-05 DIAGNOSIS — J0111 Acute recurrent frontal sinusitis: Secondary | ICD-10-CM

## 2021-12-05 NOTE — Telephone Encounter (Signed)
Routing to CMA 

## 2021-12-05 NOTE — Telephone Encounter (Signed)
Pt called and has not had her medications for the last two months and called to see if her refill can be approved for a small supply until her 10.6.23 appt / she stated no one called to inform her of anything since she has been calling since July/ please advise asap if courtesy refills can be sent to pharmacy until her appt   Atorvastatin   Cetirizine  Ferrous Gluconate  Fluticasone  Losartan Hydrochlorothiazide  Metoprolol succinate  Multi vitamins   [id number] [place] [place]

## 2021-12-06 MED ORDER — CETIRIZINE HCL 10 MG PO TABS: ORAL_TABLET | ORAL | 0 refills | Status: DC

## 2021-12-06 MED ORDER — FLUTICASONE PROPIONATE 50 MCG/ACT NA SUSP: 2.0000 | Freq: Every day | NASAL | 0 refills | Status: DC

## 2021-12-06 MED ORDER — ATORVASTATIN CALCIUM 20 MG PO TABS: 20.0000 mg | ORAL_TABLET | Freq: Every day | ORAL | 0 refills | Status: DC

## 2021-12-06 MED ORDER — METOPROLOL SUCCINATE ER 50 MG PO TB24: 50.0000 mg | ORAL_TABLET | Freq: Every day | ORAL | 0 refills | Status: DC

## 2021-12-06 MED ORDER — MULTI FOR HER PO TABS: 1.0000 | ORAL_TABLET | Freq: Every day | ORAL | 0 refills | Status: DC

## 2021-12-06 MED ORDER — LOSARTAN POTASSIUM-HCTZ 100-25 MG PO TABS: 1.0000 | ORAL_TABLET | Freq: Every day | ORAL | 0 refills | Status: DC

## 2021-12-06 MED ORDER — FERROUS GLUCONATE 324 (38 FE) MG PO TABS: ORAL_TABLET | ORAL | 0 refills | Status: DC

## 2021-12-06 NOTE — Telephone Encounter (Signed)
Rxs sent to hold her over until appointment next month.

## 2021-12-06 NOTE — Telephone Encounter (Signed)
Noted, patient informed of rx sent.

## 2021-12-20 ENCOUNTER — Ambulatory Visit: Payer: Medicare Other | Admitting: Nurse Practitioner

## 2021-12-30 ENCOUNTER — Other Ambulatory Visit: Payer: Self-pay | Admitting: Family Medicine

## 2021-12-30 DIAGNOSIS — J0111 Acute recurrent frontal sinusitis: Secondary | ICD-10-CM

## 2021-12-30 DIAGNOSIS — I10 Essential (primary) hypertension: Secondary | ICD-10-CM

## 2021-12-30 DIAGNOSIS — E785 Hyperlipidemia, unspecified: Secondary | ICD-10-CM

## 2021-12-30 DIAGNOSIS — D649 Anemia, unspecified: Secondary | ICD-10-CM

## 2022-01-02 ENCOUNTER — Ambulatory Visit: Payer: Self-pay

## 2022-01-02 NOTE — Telephone Encounter (Signed)
  Chief Complaint: Exposure to sick child.  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 Pt is afraid that child has is RSV. PT had COVID and has lingering cough,Sob and wheezing. Pt will most likely get RSV vaccine today.  Pt wants to know if this is a good idea.  Please return call.  Answer Assessment - Initial Assessment Questions 1. RESPIRATORY STATUS: "Describe your breathing?" (e.g., wheezing, shortness of breath, unable to speak, severe coughing)      Wheezing - not continuous 2. ONSET: "When did this breathing problem begin?"      Since COVID 3. PATTERN "Does the difficult breathing come and go, or has it been constant since it started?"      Comes and goes 4. SEVERITY: "How bad is your breathing?" (e.g., mild, moderate, severe)    - MILD: No SOB at rest, mild SOB with walking, speaks normally in sentences, can lie down, no retractions, pulse < 100.    - MODERATE: SOB at rest, SOB with minimal exertion and prefers to sit, cannot lie down flat, speaks in phrases, mild retractions, audible wheezing, pulse 100-120.    - SEVERE: Very SOB at rest, speaks in single words, struggling to breathe, sitting hunched forward, retractions, pulse > 120      Mild 5. RECURRENT SYMPTOM: "Have you had difficulty breathing before?" If Yes, ask: "When was the last time?" and "What happened that time?"      yes 6. CARDIAC HISTORY: "Do you have any history of heart disease?" (e.g., heart attack, angina, bypass surgery, angioplasty)      none 7. LUNG HISTORY: "Do you have any history of lung disease?"  (e.g., pulmonary embolus, asthma, emphysema)     none 8. CAUSE:  "What do you think is causing the breathing problem?"      Unsure 9. OTHER SYMPTOMS: "Do you have any other symptoms? (e.g., dizziness, runny nose, cough, chest pain, fever)     Cough, Not quite quite scratchy throat.  10. O2 SATURATION MONITOR:  "Do you use an oxygen saturation monitor (pulse oximeter) at home?" If Yes, ask: "What is your reading (oxygen level) today?" "What is your usual oxygen saturation reading?" (e.g., 95%)        11. PREGNANCY: "Is there any chance you are pregnant?" "When was your last menstrual period?"        12. TRAVEL: "Have you traveled out of the country in the last month?" (e.g., travel history, exposures)  Protocols used: Breathing Difficulty-A-AH

## 2022-01-24 ENCOUNTER — Ambulatory Visit: Payer: Self-pay | Attending: Nurse Practitioner

## 2022-01-24 ENCOUNTER — Other Ambulatory Visit: Payer: Self-pay | Admitting: Pharmacist

## 2022-01-24 ENCOUNTER — Other Ambulatory Visit: Payer: Self-pay

## 2022-01-24 DIAGNOSIS — J0111 Acute recurrent frontal sinusitis: Secondary | ICD-10-CM

## 2022-01-24 DIAGNOSIS — R7303 Prediabetes: Secondary | ICD-10-CM

## 2022-01-24 DIAGNOSIS — E785 Hyperlipidemia, unspecified: Secondary | ICD-10-CM

## 2022-01-24 DIAGNOSIS — E669 Obesity, unspecified: Secondary | ICD-10-CM

## 2022-01-24 DIAGNOSIS — D649 Anemia, unspecified: Secondary | ICD-10-CM

## 2022-01-24 DIAGNOSIS — I10 Essential (primary) hypertension: Secondary | ICD-10-CM

## 2022-01-24 MED ORDER — LOSARTAN POTASSIUM-HCTZ 100-25 MG PO TABS
1.0000 | ORAL_TABLET | Freq: Every day | ORAL | 0 refills | Status: DC
  Filled 2022-01-24: qty 30, 30d supply, fill #0

## 2022-01-24 MED ORDER — ATORVASTATIN CALCIUM 20 MG PO TABS
20.0000 mg | ORAL_TABLET | Freq: Every day | ORAL | 0 refills | Status: DC
  Filled 2022-01-24: qty 30, 30d supply, fill #0

## 2022-01-24 MED ORDER — FLUTICASONE PROPIONATE 50 MCG/ACT NA SUSP
2.0000 | Freq: Every day | NASAL | 0 refills | Status: DC
  Filled 2022-01-24: qty 16, 30d supply, fill #0

## 2022-01-24 MED ORDER — SAXENDA 18 MG/3ML ~~LOC~~ SOPN
3.0000 mg | PEN_INJECTOR | Freq: Every day | SUBCUTANEOUS | 0 refills | Status: DC
  Filled 2022-01-24: qty 15, 30d supply, fill #0

## 2022-01-24 MED ORDER — SAXENDA 18 MG/3ML ~~LOC~~ SOPN: 3.0000 mg | PEN_INJECTOR | Freq: Every day | SUBCUTANEOUS | 0 refills | Status: DC

## 2022-01-24 MED ORDER — FERROUS GLUCONATE 324 (38 FE) MG PO TABS
ORAL_TABLET | ORAL | 0 refills | Status: DC
  Filled 2022-01-24: qty 30, 30d supply, fill #0

## 2022-01-24 MED ORDER — METOPROLOL SUCCINATE ER 50 MG PO TB24
50.0000 mg | ORAL_TABLET | Freq: Every day | ORAL | 0 refills | Status: DC
  Filled 2022-01-24: qty 30, 30d supply, fill #0

## 2022-01-24 MED ORDER — LOSARTAN POTASSIUM-HCTZ 100-25 MG PO TABS: 1.0000 | ORAL_TABLET | Freq: Every day | ORAL | 0 refills | Status: DC

## 2022-01-24 MED ORDER — CETIRIZINE HCL 10 MG PO TABS
ORAL_TABLET | ORAL | 0 refills | Status: DC
  Filled 2022-01-24: qty 100, 100d supply, fill #0

## 2022-01-24 MED ORDER — FERROUS GLUCONATE 324 (38 FE) MG PO TABS: ORAL_TABLET | ORAL | 0 refills | Status: DC

## 2022-01-24 MED ORDER — FLUTICASONE PROPIONATE 50 MCG/ACT NA SUSP: 2.0000 | Freq: Every day | NASAL | 0 refills | Status: DC

## 2022-01-24 MED ORDER — CETIRIZINE HCL 10 MG PO TABS: ORAL_TABLET | ORAL | 0 refills | Status: DC

## 2022-01-24 MED ORDER — METOPROLOL SUCCINATE ER 50 MG PO TB24: 50.0000 mg | ORAL_TABLET | Freq: Every day | ORAL | 0 refills | Status: DC

## 2022-01-24 MED ORDER — ATORVASTATIN CALCIUM 20 MG PO TABS: 20.0000 mg | ORAL_TABLET | Freq: Every day | ORAL | 0 refills | Status: DC

## 2022-01-25 LAB — CMP14+EGFR
ALT: 21 IU/L (ref 0–32)
AST: 30 IU/L (ref 0–40)
Albumin/Globulin Ratio: 1.4 (ref 1.2–2.2)
Albumin: 4.2 g/dL (ref 3.9–4.9)
Alkaline Phosphatase: 94 IU/L (ref 44–121)
BUN/Creatinine Ratio: 15 (ref 12–28)
BUN: 15 mg/dL (ref 8–27)
Bilirubin Total: <0.2 mg/dL (ref 0.0–1.2)
CO2: 24 mmol/L (ref 20–29)
Calcium: 9.8 mg/dL (ref 8.7–10.3)
Chloride: 103 mmol/L (ref 96–106)
Creatinine, Ser: 1 mg/dL (ref 0.57–1.00)
Globulin, Total: 3 g/dL (ref 1.5–4.5)
Glucose: 94 mg/dL (ref 70–99)
Potassium: 4.5 mmol/L (ref 3.5–5.2)
Sodium: 144 mmol/L (ref 134–144)
Total Protein: 7.2 g/dL (ref 6.0–8.5)
eGFR: 62 mL/min/{1.73_m2} (ref >59)

## 2022-01-28 ENCOUNTER — Other Ambulatory Visit: Payer: Self-pay

## 2022-01-30 ENCOUNTER — Other Ambulatory Visit: Payer: Self-pay

## 2022-02-07 ENCOUNTER — Other Ambulatory Visit: Payer: Self-pay | Admitting: Family Medicine

## 2022-02-07 DIAGNOSIS — J0111 Acute recurrent frontal sinusitis: Secondary | ICD-10-CM

## 2022-02-10 NOTE — Telephone Encounter (Signed)
Refilled 01/24/2022 #30 0 rf Requested Prescriptions  Pending Prescriptions Disp Refills   cetirizine (ZYRTEC) 10 MG tablet [Pharmacy Med Name: CETIRIZINE HCL 10 MG TABLET] 90 tablet 1    Sig: TAKE 1 TABLET BY MOUTH EVERY DAY     Ear, Nose, and Throat:  Antihistamines 2 Passed - 02/07/2022  8:29 AM      Passed - Cr in normal range and within 360 days    Creat  Date Value Ref Range Status  04/06/2015 1.03 0.50 - 1.05 mg/dL Final   Creatinine, Ser  Date Value Ref Range Status  01/24/2022 1.00 0.57 - 1.00 mg/dL Final         Passed - Valid encounter within last 12 months    Recent Outpatient Visits           12 months ago Encounter for annual physical exam   [place] [facility] [place], Shea Stakes, NP   1 year ago Primary hypertension   [facility] [place], Shea Stakes, NP   2 years ago Need for influenza vaccination   [facility] Lois Hux[place], Cornelius Moras, RPH-CPP   2 years ago Viral conjunctivitis, left eye   [facility] [place], [place], NP   2 years ago Encounter for Papanicolaou smear for cervical cancer screening   [facility] [place], Shea Stakes, NP       Future Appointments             In 1 week Claiborne Rigg, NP [employer]

## 2022-02-21 ENCOUNTER — Encounter: Payer: Self-pay | Admitting: Nurse Practitioner

## 2022-02-21 ENCOUNTER — Ambulatory Visit: Payer: Self-pay | Attending: Nurse Practitioner | Admitting: Nurse Practitioner

## 2022-02-21 ENCOUNTER — Other Ambulatory Visit: Payer: Self-pay

## 2022-02-21 VITALS — BP 175/84 | HR 83 | Ht 62.0 in

## 2022-02-21 DIAGNOSIS — Z1231 Encounter for screening mammogram for malignant neoplasm of breast: Secondary | ICD-10-CM

## 2022-02-21 DIAGNOSIS — Z780 Asymptomatic menopausal state: Secondary | ICD-10-CM

## 2022-02-21 DIAGNOSIS — I10 Essential (primary) hypertension: Secondary | ICD-10-CM

## 2022-02-21 DIAGNOSIS — E785 Hyperlipidemia, unspecified: Secondary | ICD-10-CM

## 2022-02-21 DIAGNOSIS — R7303 Prediabetes: Secondary | ICD-10-CM

## 2022-02-21 DIAGNOSIS — D649 Anemia, unspecified: Secondary | ICD-10-CM

## 2022-02-21 DIAGNOSIS — Z13820 Encounter for screening for osteoporosis: Secondary | ICD-10-CM

## 2022-02-21 DIAGNOSIS — J3089 Other allergic rhinitis: Secondary | ICD-10-CM

## 2022-02-21 DIAGNOSIS — E669 Obesity, unspecified: Secondary | ICD-10-CM

## 2022-02-21 LAB — POCT GLYCOSYLATED HEMOGLOBIN (HGB A1C): Hemoglobin A1C: 5.9 % — AB (ref 4.0–5.6)

## 2022-02-21 MED ORDER — LOSARTAN POTASSIUM-HCTZ 100-25 MG PO TABS
1.0000 | ORAL_TABLET | Freq: Every day | ORAL | 1 refills | Status: DC
  Filled 2022-02-21: qty 30, 30d supply, fill #0
  Filled 2022-04-03: qty 30, 30d supply, fill #1
  Filled 2022-05-06: qty 30, 30d supply, fill #2

## 2022-02-21 MED ORDER — TIRZEPATIDE 2.5 MG/0.5ML ~~LOC~~ SOAJ
2.5000 mg | SUBCUTANEOUS | 1 refills | Status: DC
  Filled 2022-02-21 – 2022-02-24 (×2): qty 2, 28d supply, fill #0

## 2022-02-21 MED ORDER — ATORVASTATIN CALCIUM 20 MG PO TABS
20.0000 mg | ORAL_TABLET | Freq: Every day | ORAL | 2 refills | Status: DC
  Filled 2022-02-21 (×2): qty 30, 30d supply, fill #0
  Filled 2022-04-03 (×2): qty 30, 30d supply, fill #1
  Filled 2022-05-06: qty 30, 30d supply, fill #2
  Filled 2022-05-06: qty 90, 90d supply, fill #2
  Filled 2022-08-12: qty 90, 90d supply, fill #3

## 2022-02-21 MED ORDER — FERROUS GLUCONATE 324 (38 FE) MG PO TABS
324.0000 mg | ORAL_TABLET | Freq: Every day | ORAL | 3 refills | Status: DC
  Filled 2022-02-21 – 2022-09-08 (×2): qty 90, 90d supply, fill #0
  Filled 2022-12-16: qty 100, 100d supply, fill #1

## 2022-02-21 MED ORDER — CETIRIZINE HCL 10 MG PO TABS
10.0000 mg | ORAL_TABLET | Freq: Every day | ORAL | 3 refills | Status: DC
  Filled 2022-02-21 – 2022-09-01 (×3): qty 90, 90d supply, fill #0
  Filled 2022-11-24: qty 30, 30d supply, fill #1
  Filled 2023-01-05: qty 30, 30d supply, fill #2
  Filled 2023-01-27: qty 30, 30d supply, fill #3

## 2022-02-21 MED ORDER — FLUTICASONE PROPIONATE 50 MCG/ACT NA SUSP
2.0000 | Freq: Every day | NASAL | 0 refills | Status: DC
  Filled 2022-02-21 – 2022-02-24 (×2): qty 16, 30d supply, fill #0

## 2022-02-21 MED ORDER — METOPROLOL SUCCINATE ER 50 MG PO TB24
50.0000 mg | ORAL_TABLET | Freq: Every day | ORAL | 1 refills | Status: DC
  Filled 2022-02-21: qty 30, 30d supply, fill #0
  Filled 2022-04-03 (×2): qty 30, 30d supply, fill #1
  Filled 2022-05-06: qty 90, 90d supply, fill #2
  Filled 2022-05-06: qty 30, 30d supply, fill #2
  Filled 2022-08-04: qty 30, 30d supply, fill #3

## 2022-02-21 MED ORDER — MULTI FOR HER PO TABS
1.0000 | ORAL_TABLET | Freq: Every day | ORAL | 0 refills | Status: AC
  Filled 2022-02-21: qty 100, 100d supply, fill #0

## 2022-02-21 NOTE — Progress Notes (Signed)
No concerns. 

## 2022-02-21 NOTE — Progress Notes (Signed)
Assessment & Plan:  Paula Alexander was seen today for medication refill and hypertension.  Diagnoses and all orders for this visit:  Primary hypertension -     losartan-hydrochlorothiazide (HYZAAR) 100-25 MG tablet; Take 1 tablet by mouth daily. -     metoprolol succinate (TOPROL-XL) 50 MG 24 hr tablet; Take 1 tablet (50 mg total) by mouth daily. Take with or immediately following a meal. -     tirzepatide (MOUNJARO) 2.5 MG/0.5ML Pen; Inject 2.5 mg into the skin once a week.  Dyslipidemia, goal LDL below 100 -     atorvastatin (LIPITOR) 20 MG tablet; Take 1 tablet (20 mg total) by mouth daily. -     tirzepatide [facility]) 2.5 MG/0.5ML Pen; Inject 2.5 mg into the skin once a week.  Environmental and seasonal allergies -     cetirizine (ZYRTEC) 10 MG tablet; Take 1 tablet (10 mg total) by mouth daily. -     fluticasone (FLONASE) 50 MCG/ACT nasal spray; Place 2 sprays into both nostrils daily.  Anemia, unspecified type -     ferrous gluconate (FERGON) 324 MG tablet; Take 1 tablet (324 mg total) by mouth daily with breakfast. TAKE 1 TABLET BY MOUTH EVERY DAY WITH BREAKFAST  Prediabetes -     tirzepatide (MOUNJARO) 2.5 MG/0.5ML Pen; Inject 2.5 mg into the skin once a week. -     HgB A1c  Obesity (BMI 30-39.9) -     Multiple Vitamins-Minerals (MULTI FOR HER) TABS; Take 1 tablet by mouth daily. -     tirzepatide [facility]) 2.5 MG/0.5ML Pen; Inject 2.5 mg into the skin once a week.  Breast cancer screening by mammogram -     MM 3D SCREEN BREAST BILATERAL; Future    Patient has been counseled on age-appropriate routine health concerns for screening and prevention. These are reviewed and up-to-date. Referrals have been placed accordingly. Immunizations are up-to-date or declined.    Subjective:   Chief Complaint  Patient presents with   Medication Refill   Hypertension   HPI Paula Alexander 66 y.o. female presents to office today for HTN   She has a past medical history of Anemia,  Calcium deficiency, Heart murmur, Hyperlipidemia, Hypertension, and PVD   HTN Blood pressure is elevated today. She admits to eating cornchips today prior to her appt. She also thinks she may have dropped one of her blood pressure pills today while trying to take her medication in the car.  She is currently prescribed hyzaar 100-25 mg daily and toprol XL 50 mg daily.  BP Readings from Last 3 Encounters:  02/21/22 (!) 175/84  02/15/21 136/78  07/18/20 122/78      Prediabetes Well controlled. Would like to try mounjaro for better control with cravings and prediabetes. BMI 36.40. LDL near goal with atorvastatin 20 mg daily.  Lab Results  Component Value Date   HGBA1C 5.9 (A) 02/21/2022    Lab Results  Component Value Date   HGBA1C 6.3 (A) 02/15/2021   Lab Results  Component Value Date   LDLCALC 71 06/15/2020      Review of Systems  Constitutional:  Negative for fever, malaise/fatigue and weight loss.  HENT: Negative.  Negative for nosebleeds.   Eyes: Negative.  Negative for blurred vision, double vision and photophobia.  Respiratory: Negative.  Negative for cough and shortness of breath.   Cardiovascular: Negative.  Negative for chest pain, palpitations and leg swelling.  Gastrointestinal: Negative.  Negative for heartburn, nausea and vomiting.  Musculoskeletal: Negative.  Negative  for myalgias.  Neurological: Negative.  Negative for dizziness, focal weakness, seizures and headaches.  Endo/Heme/Allergies:  Positive for environmental allergies.  Psychiatric/Behavioral: Negative.  Negative for suicidal ideas.     Past Medical History:  Diagnosis Date   Anemia    Calcium deficiency    Heart murmur    Hyperlipidemia    Hypertension    PVD (peripheral vascular disease) (HCC)     History reviewed. No pertinent surgical history.  Family History  Problem Relation Age of Onset   Hypertension Mother    Stroke Mother    Breast cancer Mother    Multiple sclerosis Sister     Breast cancer Sister    Diabetes Son    Colon cancer Neg Hx    Esophageal cancer Neg Hx    Inflammatory bowel disease Neg Hx    Liver disease Neg Hx    Pancreatic cancer Neg Hx    Rectal cancer Neg Hx    Stomach cancer Neg Hx     Social History Reviewed with no changes to be made today.   Outpatient Medications Prior to Visit  Medication Sig Dispense Refill   aspirin EC 81 MG tablet Take 1 tablet (81 mg total) by mouth daily. 90 tablet 3   atorvastatin (LIPITOR) 20 MG tablet Take 1 tablet (20 mg total) by mouth daily. 30 tablet 0   cetirizine (ZYRTEC) 10 MG tablet *NEED OFFICE VISIT* TAKE 1 TABLET EVERY DAY 30 tablet 0   ferrous gluconate (FERGON) 324 MG tablet TAKE 1 TABLET BY MOUTH EVERY DAY WITH BREAKFAST 30 tablet 0   fluticasone (FLONASE) 50 MCG/ACT nasal spray Place 2 sprays into both nostrils daily. 16 g 0   Liraglutide -Weight Management (SAXENDA) 18 MG/3ML SOPN Inject 3 mg into the skin daily. increase by 0.6 mg daily at weekly intervals to a target dose of 3 mg once daily. 15 mL 0   losartan-hydrochlorothiazide (HYZAAR) 100-25 MG tablet Take 1 tablet by mouth daily. 30 tablet 0   metoprolol succinate (TOPROL-XL) 50 MG 24 hr tablet Take 1 tablet (50 mg total) by mouth daily. Take with or immediately following a meal. 30 tablet 0   Multiple Vitamins-Minerals (MULTI FOR HER) TABS Take 1 tablet by mouth daily. 100 tablet 0   No facility-administered medications prior to visit.    No Known Allergies     Objective:    BP (!) 175/84   Pulse 83   Ht 5\' 2"  (1.575 m)   SpO2 97%   BMI 36.40 kg/m  Wt Readings from Last 3 Encounters:  02/15/21 199 lb (90.3 kg)  07/18/20 195 lb (88.5 kg)  06/15/20 203 lb 6.4 oz (92.3 kg)    Physical Exam Vitals and nursing note reviewed.  Constitutional:      Appearance: She is well-developed.  HENT:     Head: Normocephalic and atraumatic.  Cardiovascular:     Rate and Rhythm: Normal rate and regular rhythm.     Heart sounds: Normal  heart sounds. No murmur heard.    No friction rub. No gallop.  Pulmonary:     Effort: Pulmonary effort is normal. No tachypnea or respiratory distress.     Breath sounds: Normal breath sounds. No decreased breath sounds, wheezing, rhonchi or rales.  Chest:     Chest wall: No tenderness.  Abdominal:     General: Bowel sounds are normal.     Palpations: Abdomen is soft.  Musculoskeletal:        General: Normal range  of motion.     Cervical back: Normal range of motion.  Skin:    General: Skin is warm and dry.  Neurological:     Mental Status: She is alert and oriented to person, place, and time.     Coordination: Coordination normal.  Psychiatric:        Behavior: Behavior normal. Behavior is cooperative.        Thought Content: Thought content normal.        Judgment: Judgment normal.          Patient has been counseled extensively about nutrition and exercise as well as the importance of adherence with medications and regular follow-up. The patient was given clear instructions to go to ER or return to medical center if symptoms don't improve, worsen or new problems develop. The patient verbalized understanding.   Follow-up: Return in about 3 months (around 05/23/2022) for HTN.   Claiborne Rigg, FNP-BC [facility] [place], [place] [phone]   02/21/2022, 9:20 PM

## 2022-02-24 ENCOUNTER — Ambulatory Visit: Payer: Self-pay

## 2022-02-24 ENCOUNTER — Other Ambulatory Visit: Payer: Self-pay

## 2022-02-24 ENCOUNTER — Other Ambulatory Visit (HOSPITAL_BASED_OUTPATIENT_CLINIC_OR_DEPARTMENT_OTHER): Payer: Self-pay

## 2022-02-24 NOTE — Telephone Encounter (Signed)
Summary: rx concern / rx req   Reason for CRM: The patient has been directed by their pharmacy to contact their PCP and request a prescription for Ozempic  The patient has been told that because they are not on a weight loss program they should take Ozempic  The patient has previously declined to be prescribed this medication but shares that they have had a change of heart  Please contact the patient further when possible           2nd attempt to contact patient on [phone] to review medication request. No answer. LMTCB [phone].

## 2022-02-24 NOTE — Telephone Encounter (Signed)
Summary: rx concern / rx req   Reason for CRM: The patient has been directed by their pharmacy to contact their PCP and request a prescription for Ozempic  The patient has been told that because they are not on a weight loss program they should take Ozempic  The patient has previously declined to be prescribed this medication but shares that they have had a change of heart  Please contact the patient further when possible        Called pt -[id number]

## 2022-02-25 ENCOUNTER — Other Ambulatory Visit: Payer: Self-pay

## 2022-02-25 ENCOUNTER — Other Ambulatory Visit: Payer: Self-pay | Admitting: Nurse Practitioner

## 2022-02-25 ENCOUNTER — Encounter: Payer: Self-pay | Admitting: Nurse Practitioner

## 2022-02-25 ENCOUNTER — Ambulatory Visit: Payer: Self-pay

## 2022-02-25 DIAGNOSIS — Z1231 Encounter for screening mammogram for malignant neoplasm of breast: Secondary | ICD-10-CM

## 2022-02-25 DIAGNOSIS — R7303 Prediabetes: Secondary | ICD-10-CM

## 2022-02-25 MED ORDER — OZEMPIC (0.25 OR 0.5 MG/DOSE) 2 MG/3ML ~~LOC~~ SOPN
0.2500 mg | PEN_INJECTOR | SUBCUTANEOUS | 1 refills | Status: DC
  Filled 2022-02-25: qty 3, 56d supply, fill #0
  Filled 2022-05-19: qty 3, 56d supply, fill #1

## 2022-02-25 NOTE — Telephone Encounter (Signed)
Please let her know ozempic has been ordered but may not be covered if she has not tried metformin first.

## 2022-02-25 NOTE — Telephone Encounter (Signed)
  Chief Complaint: requesting Ozempic prescription Symptoms: n/a Frequency: n/a Pertinent Negatives: Patient denies n/a Disposition: [] ED /[] Urgent Care (no appt availability in office) / [] Appointment(In office/virtual)/ []  [place] [facility]/ [] Home Care/ [] Refused Recommended Disposition /[] [place] [facility]/ [x]  Follow-up with PCP Additional Notes:      Summary: rx concern / rx req     Reason for CRM: The patient has been directed by their pharmacy to contact their PCP and request a prescription for Ozempic  The patient has been told that because they are not on a weight loss program they should take Ozempic  The patient has previously declined to be prescribed this medication but shares that they have had a change of heart  Please contact the patient further when possible          Per pt pharmacist stated that she will be able to get "samples"

## 2022-02-25 NOTE — Telephone Encounter (Signed)
Reason for Disposition . Prescription request for new medicine (not a refill)  Answer Assessment - Initial Assessment Questions 1. NAME of MEDICINE: "What medicine(s) are you calling about?"     Ozempic 2. QUESTION: "What is your question?" (e.g., double dose of medicine, side effect)     Asked if she can be prescribed this med 3. PRESCRIBER: "Who prescribed the medicine?" Reason: if prescribed by specialist, call should be referred to that group.     N/A 4. SYMPTOMS: "Do you have any symptoms?" If Yes, ask: "What symptoms are you having?"  "How bad are the symptoms (e.g., mild, moderate, severe)     no 5. PREGNANCY:  "Is there any chance that you are pregnant?" "When was your last menstrual period?"    N/a  Protocols used: Medication Question Call-A-AH

## 2022-02-26 ENCOUNTER — Other Ambulatory Visit: Payer: Self-pay

## 2022-02-26 NOTE — Telephone Encounter (Signed)
Patient identified by name and date of birth. Patient aware of response and stated that the pharmacist advised her that Ozempic is apart of a program that she'd qualifies for.

## 2022-03-04 ENCOUNTER — Other Ambulatory Visit: Payer: Self-pay

## 2022-03-06 ENCOUNTER — Ambulatory Visit: Payer: Self-pay | Attending: Family Medicine

## 2022-03-06 DIAGNOSIS — Z23 Encounter for immunization: Secondary | ICD-10-CM

## 2022-03-06 NOTE — Progress Notes (Signed)
PREVNAR-20 and Influenza (FLUAD) vaccines administered per protocols.  Information sheet given. Patient denies and pain or discomfort at injection site. Tolerated injection well no reaction.

## 2022-03-07 ENCOUNTER — Ambulatory Visit: Payer: Self-pay

## 2022-04-03 ENCOUNTER — Other Ambulatory Visit: Payer: Self-pay

## 2022-04-04 ENCOUNTER — Other Ambulatory Visit: Payer: Self-pay

## 2022-04-07 ENCOUNTER — Other Ambulatory Visit: Payer: Self-pay

## 2022-04-08 ENCOUNTER — Other Ambulatory Visit: Payer: Self-pay

## 2022-04-11 ENCOUNTER — Other Ambulatory Visit: Payer: Self-pay

## 2022-05-06 ENCOUNTER — Other Ambulatory Visit: Payer: Self-pay

## 2022-05-07 ENCOUNTER — Other Ambulatory Visit: Payer: Self-pay

## 2022-05-14 ENCOUNTER — Telehealth: Payer: Self-pay | Admitting: Nurse Practitioner

## 2022-05-14 NOTE — Telephone Encounter (Signed)
There is nothing in the chart showing that you advised someone to contact Ms. Geter.

## 2022-05-14 NOTE — Telephone Encounter (Signed)
Copied from [place] [id number]. Topic: General - Other >> May 14, 2022 10:39 AM [name] wrote: Reason for CRM: Pt states that she received missed phone call from [facility] a [employer] stating that she was calling on behalf of Paula Alexander. The number she left is going to someone else. Pt is wanting to see if Paula Alexander did have someone give her a phone call.    Pt states that she is on Ozempic .'25mg'$  , pt is having no complications and no symptoms. Pt is wanting to know when should she increase her dosage and how much she should increase it to. Pt would like for Paula Alexander to give her a call back to discuss.

## 2022-05-14 NOTE — Telephone Encounter (Signed)
We would not increase ozempic unless her A1c is higher at her next visit.

## 2022-05-15 NOTE — Telephone Encounter (Signed)
Patient identified by name and date of birth.  Patient aware of response and voiced understanding.

## 2022-05-19 ENCOUNTER — Other Ambulatory Visit: Payer: Self-pay

## 2022-05-22 ENCOUNTER — Telehealth: Payer: Self-pay

## 2022-05-22 NOTE — Telephone Encounter (Signed)
Patient attempted to be outreached by Donney Rankins, PharmD Candidate on 05/22/2022 to discuss hypertension. Left voicemail for patient to return our call at their convenience at [phone].   [place] [facility]  PharmD Candidate 2024   Maryan Puls, PharmD PGY-1 [facility] Resident

## 2022-06-06 ENCOUNTER — Ambulatory Visit: Payer: Self-pay | Attending: Nurse Practitioner | Admitting: Nurse Practitioner

## 2022-06-06 ENCOUNTER — Other Ambulatory Visit: Payer: Self-pay

## 2022-06-06 ENCOUNTER — Encounter: Payer: Self-pay | Admitting: Nurse Practitioner

## 2022-06-06 ENCOUNTER — Ambulatory Visit: Payer: Self-pay

## 2022-06-06 VITALS — BP 133/77 | HR 90 | Ht 62.0 in | Wt 188.0 lb

## 2022-06-06 DIAGNOSIS — I10 Essential (primary) hypertension: Secondary | ICD-10-CM

## 2022-06-06 DIAGNOSIS — E669 Obesity, unspecified: Secondary | ICD-10-CM

## 2022-06-06 DIAGNOSIS — D649 Anemia, unspecified: Secondary | ICD-10-CM

## 2022-06-06 DIAGNOSIS — Z2911 Encounter for prophylactic immunotherapy for respiratory syncytial virus (RSV): Secondary | ICD-10-CM

## 2022-06-06 DIAGNOSIS — R7303 Prediabetes: Secondary | ICD-10-CM

## 2022-06-06 MED ORDER — OZEMPIC (0.25 OR 0.5 MG/DOSE) 2 MG/3ML ~~LOC~~ SOPN
0.5000 mg | PEN_INJECTOR | SUBCUTANEOUS | 1 refills | Status: DC
  Filled 2022-06-06: qty 3, fill #0
  Filled 2022-07-11 – 2022-07-21 (×2): qty 6, 56d supply, fill #0

## 2022-06-06 MED ORDER — LOSARTAN POTASSIUM-HCTZ 100-25 MG PO TABS
1.0000 | ORAL_TABLET | Freq: Every day | ORAL | 1 refills | Status: DC
  Filled 2022-06-06: qty 90, 90d supply, fill #0
  Filled 2022-09-01: qty 90, 90d supply, fill #1

## 2022-06-06 MED ORDER — RSVPREF3 VAC RECOMB ADJUVANTED 120 MCG/0.5ML IM SUSR
0.5000 mL | Freq: Once | INTRAMUSCULAR | 0 refills | Status: AC
  Filled 2022-06-06: qty 0.5, 1d supply, fill #0

## 2022-06-06 NOTE — Progress Notes (Signed)
Assessment & Plan:  Paula Alexander was seen today for hypertension.  Diagnoses and all orders for this visit:  Primary hypertension -     CMP14+EGFR -     losartan-hydrochlorothiazide (HYZAAR) 100-25 MG tablet; Take 1 tablet by mouth daily. Continue all antihypertensives as prescribed.  Reminded to bring in blood pressure log for follow  up appointment.  RECOMMENDATIONS: DASH/Mediterranean Diets are healthier choices for HTN.    Obesity (BMI 30-39.9) -     Lipid panel   Prediabetes -     Hemoglobin A1c -     Lipid panel -     Semaglutide,0.25 or 0.5MG /DOS, (OZEMPIC, 0.25 OR 0.5 MG/DOSE,) 2 MG/3ML SOPN; Inject 0.5 mg into the skin once a week. Continue blood sugar control as discussed in office today, low carbohydrate diet, and regular physical exercise as tolerated, 150 minutes per week (30 min each day, 5 days per week, or 50 min 3 days per week)   Anemia, unspecified type -     CBC with Differential  Need for RSV immunization -     RSV vaccine recomb adjuvanted (AREXVY) 120 MCG/0.5ML injection; Inject 0.5 mLs into the muscle once for 1 dose.    Patient has been counseled on age-appropriate routine health concerns for screening and prevention. These are reviewed and up-to-date. Referrals have been placed accordingly. Immunizations are up-to-date or declined.    Subjective:   Chief Complaint  Patient presents with   Hypertension   HPI Paula Alexander 67 y.o. female presents to office today for follow up to HTN  She has a past medical history of Anemia, Calcium deficiency, Heart murmur, Hyperlipidemia, Hypertension, and PVD    HTN Blood pressure is normal today. She has lost 10lbs since her last office visit.  She is currently prescribed hyzaar 100-25 mg daily and toprol XL 50 mg daily.  BP Readings from Last 3 Encounters:  06/06/22 133/77  02/21/22 (!) 175/84  02/15/21 136/78    Prediabetes Well controlled. She was started on ozempic 0.25 mg weekly for better  control with cravings and prediabetes. BMI 36.40. LDL near goal with atorvastatin 20 mg daily.  Lab Results  Component Value Date   HGBA1C 5.9 (A) 02/21/2022    ROS  Past Medical History:  Diagnosis Date   Anemia    Calcium deficiency    Heart murmur    Hyperlipidemia    Hypertension    PVD (peripheral vascular disease) ([place])     History reviewed. No pertinent surgical history.  Family History  Problem Relation Age of Onset   Hypertension Mother    Stroke Mother    Breast cancer Mother    Multiple sclerosis Sister    Breast cancer Sister    Diabetes Son    Colon cancer Neg Hx    Esophageal cancer Neg Hx    Inflammatory bowel disease Neg Hx    Liver disease Neg Hx    Pancreatic cancer Neg Hx    Rectal cancer Neg Hx    Stomach cancer Neg Hx     Social History Reviewed with no changes to be made today.   Outpatient Medications Prior to Visit  Medication Sig Dispense Refill   aspirin EC 81 MG tablet Take 1 tablet (81 mg total) by mouth daily. 90 tablet 3   atorvastatin (LIPITOR) 20 MG tablet Take 1 tablet (20 mg total) by mouth daily. 90 tablet 2   cetirizine (ZYRTEC) 10 MG tablet Take 1 tablet (10 mg total) by mouth  daily. 90 tablet 3   ferrous gluconate (FERGON) 324 MG tablet Take 1 tablet (324 mg total) by mouth daily with breakfast. TAKE 1 TABLET BY MOUTH EVERY DAY WITH BREAKFAST 90 tablet 3   metoprolol succinate (TOPROL-XL) 50 MG 24 hr tablet Take 1 tablet (50 mg total) by mouth daily. Take with or immediately following a meal. 90 tablet 1   Multiple Vitamins-Minerals (MULTI FOR HER) TABS Take 1 tablet by mouth daily. 100 tablet 0   fluticasone (FLONASE) 50 MCG/ACT nasal spray Place 2 sprays into both nostrils daily. 16 g 0   losartan-hydrochlorothiazide (HYZAAR) 100-25 MG tablet Take 1 tablet by mouth daily. 90 tablet 1   Semaglutide,0.25 or 0.5MG /DOS, (OZEMPIC, 0.25 OR 0.5 MG/DOSE,) 2 MG/3ML SOPN Inject 0.25 mg into the skin once a week. 3 mL 1   No  facility-administered medications prior to visit.    No Known Allergies     Objective:    BP 133/77   Pulse 90   Ht 5\' 2"  (1.575 m)   Wt 188 lb (85.3 kg)   SpO2 95%   BMI 34.39 kg/m  Wt Readings from Last 3 Encounters:  06/06/22 188 lb (85.3 kg)  02/15/21 199 lb (90.3 kg)  07/18/20 195 lb (88.5 kg)    Physical Exam       Patient has been counseled extensively about nutrition and exercise as well as the importance of adherence with medications and regular follow-up. The patient was given clear instructions to go to ER or return to medical center if symptoms don't improve, worsen or new problems develop. The patient verbalized understanding.   Follow-up: Return in about 3 months (around 09/06/2022) for HTN.   Gildardo Pounds, FNP-BC [facility] [place], [place]   06/06/2022, 5:05 PM

## 2022-06-07 LAB — CBC WITH DIFFERENTIAL/PLATELET
Basophils Absolute: 0 10*3/uL (ref 0.0–0.2)
Basos: 0 %
EOS (ABSOLUTE): 0.1 10*3/uL (ref 0.0–0.4)
Eos: 1 %
Hematocrit: 34.3 % (ref 34.0–46.6)
Hemoglobin: 11.3 g/dL (ref 11.1–15.9)
Immature Grans (Abs): 0 10*3/uL (ref 0.0–0.1)
Immature Granulocytes: 0 %
Lymphocytes Absolute: 1.7 10*3/uL (ref 0.7–3.1)
Lymphs: 26 %
MCH: 28.1 pg (ref 26.6–33.0)
MCHC: 32.9 g/dL (ref 31.5–35.7)
MCV: 85 fL (ref 79–97)
Monocytes Absolute: 0.4 10*3/uL (ref 0.1–0.9)
Monocytes: 6 %
Neutrophils Absolute: 4.4 10*3/uL (ref 1.4–7.0)
Neutrophils: 67 %
Platelets: 260 10*3/uL (ref 150–450)
RBC: 4.02 x10E6/uL (ref 3.77–5.28)
RDW: 13.1 % (ref 11.7–15.4)
WBC: 6.7 10*3/uL (ref 3.4–10.8)

## 2022-06-07 LAB — HEMOGLOBIN A1C
Est. average glucose Bld gHb Est-mCnc: 128 mg/dL
Hgb A1c MFr Bld: 6.1 % — ABNORMAL HIGH (ref 4.8–5.6)

## 2022-06-07 LAB — LIPID PANEL
Chol/HDL Ratio: 2.6 ratio (ref 0.0–4.4)
Cholesterol, Total: 140 mg/dL (ref 100–199)
HDL: 53 mg/dL (ref >39)
LDL Chol Calc (NIH): 71 mg/dL (ref 0–99)
Triglycerides: 83 mg/dL (ref 0–149)
VLDL Cholesterol Cal: 16 mg/dL (ref 5–40)

## 2022-06-07 LAB — CMP14+EGFR
ALT: 17 IU/L (ref 0–32)
AST: 27 IU/L (ref 0–40)
Albumin/Globulin Ratio: 1.4 (ref 1.2–2.2)
Albumin: 4.2 g/dL (ref 3.9–4.9)
Alkaline Phosphatase: 98 IU/L (ref 44–121)
BUN/Creatinine Ratio: 17 (ref 12–28)
BUN: 18 mg/dL (ref 8–27)
Bilirubin Total: 0.2 mg/dL (ref 0.0–1.2)
CO2: 24 mmol/L (ref 20–29)
Calcium: 10.3 mg/dL (ref 8.7–10.3)
Chloride: 105 mmol/L (ref 96–106)
Creatinine, Ser: 1.07 mg/dL — ABNORMAL HIGH (ref 0.57–1.00)
Globulin, Total: 3 g/dL (ref 1.5–4.5)
Glucose: 84 mg/dL (ref 70–99)
Potassium: 4.1 mmol/L (ref 3.5–5.2)
Sodium: 145 mmol/L — ABNORMAL HIGH (ref 134–144)
Total Protein: 7.2 g/dL (ref 6.0–8.5)
eGFR: 57 mL/min/{1.73_m2} — ABNORMAL LOW (ref >59)

## 2022-07-11 ENCOUNTER — Other Ambulatory Visit: Payer: Self-pay

## 2022-07-17 ENCOUNTER — Other Ambulatory Visit: Payer: Self-pay

## 2022-07-21 ENCOUNTER — Other Ambulatory Visit: Payer: Self-pay

## 2022-07-28 ENCOUNTER — Other Ambulatory Visit: Payer: Self-pay

## 2022-07-28 NOTE — Progress Notes (Signed)
   Paula Alexander [birth year] [id number]   Patient seen by Seward Meth, PharmD Candidate and Haze Boyden, PharmD Candidate on 07/28/22 while they were picking up prescriptions at [facility].  Blood Pressure Readings: Systolic BP today: 134 Diastolic BP today: 79 Does the patient have a validated home blood pressure machine?: Yes   Medication review was performed. Is the patient taking their medications as prescribed?: Yes   The following barriers to adherence were noted: Does the patient have cost concerns?: No Does the patient have transportation concerns?: No Does the patient need assistance obtaining refills?: No Does the patient occassionally forget to take some of their prescribed medications?: No Does the patient feel like one/some of their medications make them feel poorly?: No Does the patient have questions or concerns about their medications?: No Does the patient have a follow up scheduled with their primary care provider/cardiologist?: No   Interventions: Interventions Completed: Medications were reviewed, Patient was educated on goal blood pressures and long term health implications of elevated blood pressure  The patient has follow up scheduled:  PCP: Claiborne Rigg, NP   Seward Meth, Student-PharmD

## 2022-08-04 ENCOUNTER — Other Ambulatory Visit: Payer: Self-pay

## 2022-08-05 ENCOUNTER — Other Ambulatory Visit: Payer: Self-pay

## 2022-08-12 ENCOUNTER — Other Ambulatory Visit: Payer: Self-pay

## 2022-08-18 ENCOUNTER — Other Ambulatory Visit: Payer: Self-pay

## 2022-08-29 ENCOUNTER — Other Ambulatory Visit: Payer: Self-pay

## 2022-09-01 ENCOUNTER — Other Ambulatory Visit: Payer: Self-pay | Admitting: Nurse Practitioner

## 2022-09-01 ENCOUNTER — Other Ambulatory Visit: Payer: Self-pay

## 2022-09-01 DIAGNOSIS — I10 Essential (primary) hypertension: Secondary | ICD-10-CM

## 2022-09-01 MED ORDER — METOPROLOL SUCCINATE ER 50 MG PO TB24
50.0000 mg | ORAL_TABLET | Freq: Every day | ORAL | 0 refills | Status: DC
  Filled 2022-09-01: qty 90, 90d supply, fill #0

## 2022-09-01 NOTE — Telephone Encounter (Signed)
Medication Refill - Medication: losartan-hydrochlorothiazide (HYZAAR) 100-25 MG tablet / Pt is all out of the meds/she is asking for a temporary supply if she can't get filled today, I let her know of 48-72 hr turn around  Has the patient contacted their pharmacy? Yes   (Agent: If no, request that the patient contact the pharmacy for the refill. If patient does not wish to contact the pharmacy document the reason why and proceed with request.) (Agent: If yes, when and what did the pharmacy advise?)contact pcp  Preferred Pharmacy (with phone number or street name):  Has the patient been seen for an appointment in the last year OR does the patient have an upcoming appointment? yes  Agent: Please be advised that RX refills may take up to 3 business days. We ask that you follow-up with your pharmacy.

## 2022-09-01 NOTE — Telephone Encounter (Signed)
Patient request short supply.

## 2022-09-02 ENCOUNTER — Other Ambulatory Visit: Payer: Self-pay

## 2022-09-02 NOTE — Telephone Encounter (Signed)
Call to pharmacy- Rx is ready to pick up- patient notified. Requested Prescriptions  Pending Prescriptions Disp Refills   losartan-hydrochlorothiazide (HYZAAR) 100-25 MG tablet 90 tablet 1    Sig: Take 1 tablet by mouth daily.     Cardiovascular: ARB + Diuretic Combos Failed - 09/01/2022 10:30 AM      Failed - Na in normal range and within 180 days    Sodium  Date Value Ref Range Status  06/06/2022 145 (H) 134 - 144 mmol/L Final         Failed - Cr in normal range and within 180 days    Creat  Date Value Ref Range Status  04/06/2015 1.03 0.50 - 1.05 mg/dL Final   Creatinine, Ser  Date Value Ref Range Status  06/06/2022 1.07 (H) 0.57 - 1.00 mg/dL Final         Passed - K in normal range and within 180 days    Potassium  Date Value Ref Range Status  06/06/2022 4.1 3.5 - 5.2 mmol/L Final         Passed - eGFR is 10 or above and within 180 days    GFR, Est African American  Date Value Ref Range Status  04/06/2015 69 >=60 mL/min Final   GFR calc Af Amer  Date Value Ref Range Status  04/04/2019 66 >59 mL/min/1.73 Final   GFR, Est Non African American  Date Value Ref Range Status  04/06/2015 60 >=60 mL/min Final    Comment:      The estimated GFR is a calculation valid for adults (>=23 years old) that uses the CKD-EPI algorithm to adjust for age and sex. It is   not to be used for children, pregnant women, hospitalized patients,    patients on dialysis, or with rapidly changing kidney function. According to the NKDEP, eGFR >89 is normal, 60-89 shows mild impairment, 30-59 shows moderate impairment, 15-29 shows severe impairment and <15 is ESRD.      GFR calc non Af Amer  Date Value Ref Range Status  04/04/2019 57 (L) >59 mL/min/1.73 Final   eGFR  Date Value Ref Range Status  06/06/2022 57 (L) >59 mL/min/1.73 Final         Passed - Patient is not pregnant      Passed - Last BP in normal range    BP Readings from Last 1 Encounters:  07/28/22 134/79          Passed - Valid encounter within last 6 months    Recent Outpatient Visits           2 months ago Primary hypertension   [place] [facility] [place], Shea Stakes, NP   6 months ago Primary hypertension   [place] [facility] [place], Shea Stakes, NP   1 year ago Encounter for annual physical exam   [facility] [employer], Shea Stakes, NP   2 years ago Primary hypertension   [place] [facility] [place], Shea Stakes, NP   2 years ago Need for influenza vaccination   [facility] Drucilla Chalet, RPH-CPP       Future Appointments             In 6 days Claiborne Rigg, NP [facility]

## 2022-09-08 ENCOUNTER — Other Ambulatory Visit: Payer: Self-pay

## 2022-09-08 ENCOUNTER — Encounter: Payer: Self-pay | Admitting: Nurse Practitioner

## 2022-09-08 ENCOUNTER — Ambulatory Visit: Payer: Self-pay | Attending: Nurse Practitioner | Admitting: Nurse Practitioner

## 2022-09-08 VITALS — BP 136/78 | HR 92 | Ht 62.0 in | Wt 190.2 lb

## 2022-09-08 DIAGNOSIS — I739 Peripheral vascular disease, unspecified: Secondary | ICD-10-CM

## 2022-09-08 DIAGNOSIS — R7303 Prediabetes: Secondary | ICD-10-CM

## 2022-09-08 DIAGNOSIS — I10 Essential (primary) hypertension: Secondary | ICD-10-CM

## 2022-09-08 DIAGNOSIS — E785 Hyperlipidemia, unspecified: Secondary | ICD-10-CM

## 2022-09-08 MED ORDER — METOPROLOL SUCCINATE ER 50 MG PO TB24
50.0000 mg | ORAL_TABLET | Freq: Every day | ORAL | 1 refills | Status: DC
Start: 2022-09-08 — End: 2023-06-16
  Filled 2022-09-08: qty 90, 90d supply, fill #0
  Filled 2022-11-24: qty 30, 30d supply, fill #0
  Filled 2023-01-05: qty 30, 30d supply, fill #1
  Filled 2023-01-27: qty 30, 30d supply, fill #2
  Filled 2023-03-12 (×2): qty 30, 30d supply, fill #3
  Filled 2023-04-14: qty 30, 30d supply, fill #4
  Filled 2023-05-18: qty 30, 30d supply, fill #5

## 2022-09-08 MED ORDER — ATORVASTATIN CALCIUM 20 MG PO TABS
20.0000 mg | ORAL_TABLET | Freq: Every day | ORAL | 2 refills | Status: DC
Start: 2022-09-08 — End: 2023-08-11
  Filled 2022-09-08: qty 90, 90d supply, fill #0
  Filled 2022-11-21: qty 30, 30d supply, fill #0
  Filled 2022-12-16: qty 30, 30d supply, fill #1
  Filled 2022-12-16: qty 24, 24d supply, fill #1
  Filled 2023-01-27: qty 30, 30d supply, fill #2
  Filled 2023-03-12 (×2): qty 30, 30d supply, fill #3
  Filled 2023-04-14: qty 30, 30d supply, fill #4
  Filled 2023-05-18: qty 30, 30d supply, fill #5
  Filled 2023-06-16 (×2): qty 30, 30d supply, fill #6
  Filled 2023-07-20: qty 30, 30d supply, fill #7

## 2022-09-08 MED ORDER — LOSARTAN POTASSIUM-HCTZ 100-25 MG PO TABS
1.0000 | ORAL_TABLET | Freq: Every day | ORAL | 1 refills | Status: DC
Start: 2022-09-08 — End: 2023-06-16
  Filled 2022-09-08: qty 90, 90d supply, fill #0
  Filled 2022-11-24: qty 30, 30d supply, fill #0
  Filled 2023-01-05: qty 30, 30d supply, fill #1
  Filled 2023-01-27: qty 30, 30d supply, fill #2
  Filled 2023-03-12 (×2): qty 30, 30d supply, fill #3
  Filled 2023-04-14: qty 30, 30d supply, fill #4
  Filled 2023-05-18: qty 30, 30d supply, fill #5

## 2022-09-08 MED ORDER — ASPIRIN 81 MG PO TBEC
81.0000 mg | DELAYED_RELEASE_TABLET | Freq: Every day | ORAL | 3 refills | Status: DC
Start: 2022-09-08 — End: 2023-08-11

## 2022-09-08 NOTE — Progress Notes (Signed)
Assessment & Plan:  Paula Alexander was seen today for hypertension.  Diagnoses and all orders for this visit:  Primary hypertension -     CMP14+EGFR -     losartan-hydrochlorothiazide (HYZAAR) 100-25 MG tablet; Take 1 tablet by mouth daily. -     metoprolol succinate (TOPROL-XL) 50 MG 24 hr tablet; Take 1 tablet (50 mg total) by mouth daily. Take with or immediately following a meal.  Prediabetes -     Hemoglobin A1c  PVD (peripheral vascular disease) (HCC) Continue ASA 81 mg daily  Dyslipidemia, goal LDL below 100 -     atorvastatin (LIPITOR) 20 MG tablet; Take 1 tablet (20 mg total) by mouth daily.    Patient has been counseled on age-appropriate routine health concerns for screening and prevention. These are reviewed and up-to-date. Referrals have been placed accordingly. Immunizations are up-to-date or declined.    Subjective:   Chief Complaint  Patient presents with   Hypertension   Hypertension Pertinent negatives include no blurred vision, chest pain, headaches, malaise/fatigue, palpitations or shortness of breath.   Paula Alexander 67 y.o. female presents to office today for follow up to HTN   She has a past medical history of Anemia, Calcium deficiency, Heart murmur, Hyperlipidemia, Hypertension, and PVD     HTN Blood pressure is normal today. She has lost 10lbs since her last office visit.  She is currently prescribed hyzaar 100-25 mg daily and toprol XL 50 mg daily.  BP Readings from Last 3 Encounters:  09/08/22 136/78  07/28/22 134/79  06/06/22 133/77    Prediabetes Well controlled. She was started on ozempic 0.25 mg weekly for better control with cravings and prediabetes. BMI 36.40. LDL near goal with atorvastatin 20 mg daily.  Lab Results  Component Value Date   HGBA1C 6.1 (H) 06/06/2022      Review of Systems  Constitutional:  Negative for fever, malaise/fatigue and weight loss.  HENT: Negative.  Negative for nosebleeds.   Eyes: Negative.   Negative for blurred vision, double vision and photophobia.  Respiratory: Negative.  Negative for cough and shortness of breath.   Cardiovascular: Negative.  Negative for chest pain, palpitations and leg swelling.  Gastrointestinal: Negative.  Negative for heartburn, nausea and vomiting.  Musculoskeletal: Negative.  Negative for myalgias.  Neurological: Negative.  Negative for dizziness, focal weakness, seizures and headaches.  Psychiatric/Behavioral: Negative.  Negative for suicidal ideas.     Past Medical History:  Diagnosis Date   Anemia    Calcium deficiency    Heart murmur    Hyperlipidemia    Hypertension    PVD (peripheral vascular disease) (HCC)     No past surgical history on file.  Family History  Problem Relation Age of Onset   Hypertension Mother    Stroke Mother    Breast cancer Mother    Multiple sclerosis Sister    Breast cancer Sister    Diabetes Son    Colon cancer Neg Hx    Esophageal cancer Neg Hx    Inflammatory bowel disease Neg Hx    Liver disease Neg Hx    Pancreatic cancer Neg Hx    Rectal cancer Neg Hx    Stomach cancer Neg Hx     Social History Reviewed with no changes to be made today.   Outpatient Medications Prior to Visit  Medication Sig Dispense Refill   cetirizine (ZYRTEC) 10 MG tablet Take 1 tablet (10 mg total) by mouth daily. 90 tablet 3   ferrous gluconate (  FERGON) 324 MG tablet Take 1 tablet (324 mg total) by mouth daily with breakfast. TAKE 1 TABLET BY MOUTH EVERY DAY WITH BREAKFAST 90 tablet 3   Multiple Vitamins-Minerals (MULTI FOR HER) TABS Take 1 tablet by mouth daily. 100 tablet 0   Semaglutide,0.25 or 0.5MG /DOS, (OZEMPIC, 0.25 OR 0.5 MG/DOSE,) 2 MG/3ML SOPN Inject 0.5 mg into the skin once a week. 3 mL 1   aspirin EC 81 MG tablet Take 1 tablet (81 mg total) by mouth daily. 90 tablet 3   atorvastatin (LIPITOR) 20 MG tablet Take 1 tablet (20 mg total) by mouth daily. 90 tablet 2   losartan-hydrochlorothiazide (HYZAAR) 100-25 MG  tablet Take 1 tablet by mouth daily. 90 tablet 1   metoprolol succinate (TOPROL-XL) 50 MG 24 hr tablet Take 1 tablet (50 mg total) by mouth daily. Take with or immediately following a meal. 90 tablet 0   No facility-administered medications prior to visit.    No Known Allergies     Objective:    BP 136/78 (BP Location: Left Arm, Patient Position: Sitting, Cuff Size: Large)   Pulse 92   Ht 5\' 2"  (1.575 m)   Wt 190 lb 3.2 oz (86.3 kg)   SpO2 97%   BMI 34.79 kg/m  Wt Readings from Last 3 Encounters:  09/08/22 190 lb 3.2 oz (86.3 kg)  06/06/22 188 lb (85.3 kg)  02/15/21 199 lb (90.3 kg)    Physical Exam Vitals and nursing note reviewed.  Constitutional:      Appearance: She is well-developed.  HENT:     Head: Normocephalic and atraumatic.  Cardiovascular:     Rate and Rhythm: Normal rate and regular rhythm.     Heart sounds: Normal heart sounds. No murmur heard.    No friction rub. No gallop.  Pulmonary:     Effort: Pulmonary effort is normal. No tachypnea or respiratory distress.     Breath sounds: Normal breath sounds. No decreased breath sounds, wheezing, rhonchi or rales.  Chest:     Chest wall: No tenderness.  Abdominal:     General: Bowel sounds are normal.     Palpations: Abdomen is soft.  Musculoskeletal:        General: Normal range of motion.     Cervical back: Normal range of motion.  Skin:    General: Skin is warm and dry.  Neurological:     Mental Status: She is alert and oriented to person, place, and time.     Coordination: Coordination normal.  Psychiatric:        Behavior: Behavior normal. Behavior is cooperative.        Thought Content: Thought content normal.        Judgment: Judgment normal.          Patient has been counseled extensively about nutrition and exercise as well as the importance of adherence with medications and regular follow-up. The patient was given clear instructions to go to ER or return to medical center if symptoms don't  improve, worsen or new problems develop. The patient verbalized understanding.   Follow-up: Return in about 3 months (around 12/09/2022).   Claiborne Rigg, FNP-BC [facility] [place], [place] [phone]   09/08/2022, 3:50 PM

## 2022-09-09 LAB — CMP14+EGFR
ALT: 20 IU/L (ref 0–32)
AST: 25 IU/L (ref 0–40)
Albumin: 4.4 g/dL (ref 3.9–4.9)
Alkaline Phosphatase: 93 IU/L (ref 44–121)
BUN/Creatinine Ratio: 19 (ref 12–28)
BUN: 19 mg/dL (ref 8–27)
Bilirubin Total: 0.3 mg/dL (ref 0.0–1.2)
CO2: 23 mmol/L (ref 20–29)
Calcium: 10.2 mg/dL (ref 8.7–10.3)
Chloride: 103 mmol/L (ref 96–106)
Creatinine, Ser: 1.01 mg/dL — ABNORMAL HIGH (ref 0.57–1.00)
Globulin, Total: 2.7 g/dL (ref 1.5–4.5)
Glucose: 77 mg/dL (ref 70–99)
Potassium: 4.3 mmol/L (ref 3.5–5.2)
Sodium: 141 mmol/L (ref 134–144)
Total Protein: 7.1 g/dL (ref 6.0–8.5)
eGFR: 61 mL/min/{1.73_m2} (ref >59)

## 2022-09-09 LAB — HEMOGLOBIN A1C
Est. average glucose Bld gHb Est-mCnc: 128 mg/dL
Hgb A1c MFr Bld: 6.1 % — ABNORMAL HIGH (ref 4.8–5.6)

## 2022-09-10 ENCOUNTER — Other Ambulatory Visit: Payer: Self-pay

## 2022-09-11 ENCOUNTER — Other Ambulatory Visit: Payer: Self-pay

## 2022-09-15 ENCOUNTER — Other Ambulatory Visit: Payer: Self-pay

## 2022-09-17 ENCOUNTER — Other Ambulatory Visit: Payer: Self-pay

## 2022-10-06 ENCOUNTER — Other Ambulatory Visit: Payer: Self-pay | Admitting: Nurse Practitioner

## 2022-10-06 ENCOUNTER — Other Ambulatory Visit: Payer: Self-pay

## 2022-10-06 DIAGNOSIS — R7303 Prediabetes: Secondary | ICD-10-CM

## 2022-10-07 ENCOUNTER — Other Ambulatory Visit: Payer: Self-pay

## 2022-10-07 MED ORDER — OZEMPIC (0.25 OR 0.5 MG/DOSE) 2 MG/3ML ~~LOC~~ SOPN
0.5000 mg | PEN_INJECTOR | SUBCUTANEOUS | 1 refills | Status: DC
Start: 2022-10-07 — End: 2022-10-15
  Filled 2022-10-07: qty 3, 28d supply, fill #0

## 2022-10-07 NOTE — Telephone Encounter (Signed)
Requested Prescriptions  Pending Prescriptions Disp Refills   Semaglutide,0.25 or 0.5MG /DOS, (OZEMPIC, 0.25 OR 0.5 MG/DOSE,) 2 MG/3ML SOPN 3 mL 1    Sig: Inject 0.5 mg into the skin once a week.     Endocrinology:  Diabetes - GLP-1 Receptor Agonists - semaglutide Failed - 10/06/2022 11:04 AM      Failed - HBA1C in normal range and within 180 days    HbA1c, POC (controlled diabetic range)  Date Value Ref Range Status  06/15/2020 5.8 0.0 - 7.0 % Final   Hgb A1c MFr Bld  Date Value Ref Range Status  09/08/2022 6.1 (H) 4.8 - 5.6 % Final    Comment:             Prediabetes: 5.7 - 6.4          Diabetes: >6.4          Glycemic control for adults with diabetes: <7.0          Failed - Cr in normal range and within 360 days    Creat  Date Value Ref Range Status  04/06/2015 1.03 0.50 - 1.05 mg/dL Final   Creatinine, Ser  Date Value Ref Range Status  09/08/2022 1.01 (H) 0.57 - 1.00 mg/dL Final         Passed - Valid encounter within last 6 months    Recent Outpatient Visits           4 weeks ago Primary hypertension   [facility] [place], Shea Stakes, NP   4 months ago Primary hypertension   [place] [facility] [place], Shea Stakes, NP   7 months ago Primary hypertension   [facility] [place], Shea Stakes, NP   1 year ago Encounter for annual physical exam   [facility] [place], Shea Stakes, NP   2 years ago Primary hypertension   [place] [facility] [place], Shea Stakes, NP

## 2022-10-14 ENCOUNTER — Other Ambulatory Visit: Payer: Self-pay

## 2022-10-15 ENCOUNTER — Ambulatory Visit: Payer: Self-pay | Admitting: *Deleted

## 2022-10-15 ENCOUNTER — Other Ambulatory Visit: Payer: Self-pay

## 2022-10-15 NOTE — Telephone Encounter (Signed)
Message from [place] T sent at 10/15/2022 11:11 AM EDT  Summary: discontinued medication   Patient called stated she has discontinued the Ozempic due to the fear of losing sight in her right eye as she has experienced some of the same symptoms she felt when she lost sight in her left eye          Call History  Contact Date/Time Type Contact Phone/Fax User  10/15/2022 11:09 AM EDT Phone (Incoming) Kenlynn, Delgardo F (Self) [phone] Angelina Sheriff   Reason for Disposition  [1] Caller has NON-URGENT medicine question about med that PCP prescribed AND [2] triager unable to answer question    Stopped Ozempic due to changes in her right eye with visual changes.  Answer Assessment - Initial Assessment Questions 1. NAME of MEDICINE: "What medicine(s) are you calling about?"     Ozempic 2. 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        I can feel a pulling sensation in my right eye which is similar to feelings I had with my left eye.   My vision is blurry and getting worse.    One of the side effects of Ozempic involves the eyes.   So I stopped the Ozempic at the first of July.   I just wanted to let Bertram Denver, NP know that.  They don't check my eyes at [facility] so I don't feel I need to come in.   I'm going to make an appt with my eye doctor.   I encouraged her to do that as soon as possible.   She was agreeable to this. 3. PRESCRIBER: "Who prescribed the medicine?" Reason: if prescribed by specialist, call should be referred to that group.     Bertram Denver, NP 4. SYMPTOMS: "Do you have any symptoms?" If Yes, ask: "What symptoms are you having?"  "How bad are the symptoms (e.g., mild, moderate, severe)     A pulling sensation in her right eye and her vision becoming blurry and getting worse.   This is is same feelings she  had in her left eye when she started having problems with it several years ago.    5. PREGNANCY:  "Is there any chance that you are pregnant?" "When was your last menstrual period?"     N/A due to age  Protocols used: Medication Question Call-A-AH

## 2022-10-15 NOTE — Telephone Encounter (Signed)
  Chief Complaint: Stopped Ozempic due to visual changes in right eye similar to symptoms she had in her left eye a few years ago.   She is legally blind in left eye now. Symptoms: A pulling sensation in her right eye and vision being blurry like she had in her left eye when it gave her problems a few years ago. Frequency: She read the side effects of Ozempic and her daughter gave her an article on visual changes with being on Ozempic.  She stopped the Ozempic at the first of July. Pertinent Negatives: Patient denies needing to see Paula Denver, NP.   I'm calling my eye doctor.   I encouraged her to do that as soon as possible. Disposition: [] ED /[] Urgent Care (no appt availability in office) / [] Appointment(In office/virtual)/ []  [facility]/ [] Home Care/ [] Refused Recommended Disposition /[] [facility]/ [x]  Follow-up with PCP Additional Notes: Message sent to Paula Denver, NP that pt has stopped her Ozempic due to changes in her right eye sight and is concerned it's a side effect of the Ozempic which she read about.  She is make ing an appt with her eye dr.

## 2022-10-15 NOTE — Telephone Encounter (Signed)
noted 

## 2022-11-07 ENCOUNTER — Inpatient Hospital Stay: Admission: RE | Admit: 2022-11-07 | Payer: Self-pay | Source: Ambulatory Visit

## 2022-11-21 ENCOUNTER — Other Ambulatory Visit: Payer: Self-pay

## 2022-11-24 ENCOUNTER — Other Ambulatory Visit: Payer: Self-pay

## 2022-12-16 ENCOUNTER — Other Ambulatory Visit: Payer: Self-pay

## 2022-12-17 ENCOUNTER — Other Ambulatory Visit: Payer: Self-pay

## 2022-12-22 ENCOUNTER — Other Ambulatory Visit: Payer: Self-pay

## 2023-01-05 ENCOUNTER — Other Ambulatory Visit: Payer: Self-pay

## 2023-01-14 ENCOUNTER — Other Ambulatory Visit: Payer: Self-pay | Admitting: Nurse Practitioner

## 2023-01-14 NOTE — Telephone Encounter (Signed)
Medication Refill - Medication: Albuterol 2 puffs every 6hrs daily  Has the patient contacted their pharmacy? No.   Preferred Pharmacy (with phone number or street name):  [facility] Phone: [phone]  Fax: [phone]     Has the patient been seen for an appointment in the last year OR does the patient have an upcoming appointment? No.  Agent: Please be advised that RX refills may take up to 3 business days. We ask that you follow-up with your pharmacy.

## 2023-01-15 NOTE — Telephone Encounter (Signed)
Hey friend,   I have pended a Proventil rxn to this message for approval if appropriate.

## 2023-01-18 MED ORDER — ALBUTEROL SULFATE HFA 108 (90 BASE) MCG/ACT IN AERS
2.0000 | INHALATION_SPRAY | Freq: Four times a day (QID) | RESPIRATORY_TRACT | 0 refills | Status: DC | PRN
  Filled 2023-01-18: qty 6.7, 25d supply, fill #0

## 2023-01-19 ENCOUNTER — Other Ambulatory Visit: Payer: Self-pay

## 2023-01-27 ENCOUNTER — Other Ambulatory Visit: Payer: Self-pay

## 2023-01-28 ENCOUNTER — Other Ambulatory Visit: Payer: Self-pay

## 2023-02-02 ENCOUNTER — Other Ambulatory Visit: Payer: Self-pay

## 2023-03-12 ENCOUNTER — Other Ambulatory Visit: Payer: Self-pay

## 2023-03-12 ENCOUNTER — Other Ambulatory Visit: Payer: Self-pay | Admitting: Nurse Practitioner

## 2023-03-12 DIAGNOSIS — J3089 Other allergic rhinitis: Secondary | ICD-10-CM

## 2023-03-12 MED ORDER — CETIRIZINE HCL 10 MG PO TABS
10.0000 mg | ORAL_TABLET | Freq: Every day | ORAL | 0 refills | Status: DC
Start: 2023-03-12 — End: 2023-06-16
  Filled 2023-03-12: qty 90, 90d supply, fill #0

## 2023-04-14 ENCOUNTER — Other Ambulatory Visit: Payer: Self-pay | Admitting: Nurse Practitioner

## 2023-04-14 ENCOUNTER — Other Ambulatory Visit: Payer: Self-pay

## 2023-04-14 DIAGNOSIS — D649 Anemia, unspecified: Secondary | ICD-10-CM

## 2023-04-14 DIAGNOSIS — J3089 Other allergic rhinitis: Secondary | ICD-10-CM

## 2023-04-14 MED ORDER — FERROUS GLUCONATE 324 (38 FE) MG PO TABS
324.0000 mg | ORAL_TABLET | Freq: Every day | ORAL | 0 refills | Status: DC
Start: 2023-04-14 — End: 2023-08-11
  Filled 2023-04-14: qty 30, 30d supply, fill #0

## 2023-04-14 MED ORDER — ALBUTEROL SULFATE HFA 108 (90 BASE) MCG/ACT IN AERS
2.0000 | INHALATION_SPRAY | Freq: Four times a day (QID) | RESPIRATORY_TRACT | 0 refills | Status: DC | PRN
  Filled 2023-04-14: qty 6.7, 25d supply, fill #0

## 2023-04-14 MED ORDER — FLUTICASONE PROPIONATE 50 MCG/ACT NA SUSP
2.0000 | Freq: Every day | NASAL | 0 refills | Status: DC
Start: 2023-04-14 — End: 2023-08-11
  Filled 2023-04-14: qty 16, 30d supply, fill #0

## 2023-04-15 ENCOUNTER — Other Ambulatory Visit: Payer: Self-pay

## 2023-04-17 ENCOUNTER — Other Ambulatory Visit: Payer: Self-pay

## 2023-05-18 ENCOUNTER — Other Ambulatory Visit: Payer: Self-pay

## 2023-05-19 ENCOUNTER — Other Ambulatory Visit: Payer: Self-pay

## 2023-05-20 ENCOUNTER — Other Ambulatory Visit: Payer: Self-pay

## 2023-06-16 ENCOUNTER — Other Ambulatory Visit: Payer: Self-pay

## 2023-06-16 ENCOUNTER — Other Ambulatory Visit: Payer: Self-pay | Admitting: Nurse Practitioner

## 2023-06-16 DIAGNOSIS — J3089 Other allergic rhinitis: Secondary | ICD-10-CM

## 2023-06-16 DIAGNOSIS — I10 Essential (primary) hypertension: Secondary | ICD-10-CM

## 2023-06-16 MED ORDER — LOSARTAN POTASSIUM-HCTZ 100-25 MG PO TABS
1.0000 | ORAL_TABLET | Freq: Every day | ORAL | 0 refills | Status: DC
Start: 2023-06-16 — End: 2023-07-23
  Filled 2023-06-16: qty 30, 30d supply, fill #0

## 2023-06-16 MED ORDER — METOPROLOL SUCCINATE ER 50 MG PO TB24
50.0000 mg | ORAL_TABLET | Freq: Every day | ORAL | 0 refills | Status: DC
  Filled 2023-06-16: qty 30, 30d supply, fill #0

## 2023-06-16 MED ORDER — CETIRIZINE HCL 10 MG PO TABS
10.0000 mg | ORAL_TABLET | Freq: Every day | ORAL | 0 refills | Status: DC
  Filled 2023-06-16: qty 30, 30d supply, fill #0

## 2023-06-17 ENCOUNTER — Other Ambulatory Visit: Payer: Self-pay

## 2023-06-18 ENCOUNTER — Other Ambulatory Visit: Payer: Self-pay

## 2023-07-20 ENCOUNTER — Other Ambulatory Visit: Payer: Self-pay | Admitting: Family Medicine

## 2023-07-20 ENCOUNTER — Other Ambulatory Visit: Payer: Self-pay

## 2023-07-20 DIAGNOSIS — J3089 Other allergic rhinitis: Secondary | ICD-10-CM

## 2023-07-20 DIAGNOSIS — I10 Essential (primary) hypertension: Secondary | ICD-10-CM

## 2023-07-22 ENCOUNTER — Other Ambulatory Visit: Payer: Self-pay

## 2023-07-23 ENCOUNTER — Other Ambulatory Visit: Payer: Self-pay

## 2023-07-23 ENCOUNTER — Other Ambulatory Visit: Payer: Self-pay | Admitting: Family Medicine

## 2023-07-23 ENCOUNTER — Other Ambulatory Visit (HOSPITAL_COMMUNITY): Payer: Self-pay

## 2023-07-23 DIAGNOSIS — J3089 Other allergic rhinitis: Secondary | ICD-10-CM

## 2023-07-23 DIAGNOSIS — I10 Essential (primary) hypertension: Secondary | ICD-10-CM

## 2023-07-23 MED ORDER — CETIRIZINE HCL 10 MG PO TABS
10.0000 mg | ORAL_TABLET | Freq: Every day | ORAL | 0 refills | Status: DC
  Filled 2023-07-23: qty 30, 30d supply, fill #0

## 2023-07-23 MED ORDER — LOSARTAN POTASSIUM-HCTZ 100-25 MG PO TABS
1.0000 | ORAL_TABLET | Freq: Every day | ORAL | 0 refills | Status: DC
  Filled 2023-07-23: qty 30, 30d supply, fill #0

## 2023-07-23 MED ORDER — METOPROLOL SUCCINATE ER 50 MG PO TB24
50.0000 mg | ORAL_TABLET | Freq: Every day | ORAL | 0 refills | Status: DC
  Filled 2023-07-23: qty 30, 30d supply, fill #0

## 2023-07-24 ENCOUNTER — Other Ambulatory Visit: Payer: Self-pay

## 2023-08-11 ENCOUNTER — Other Ambulatory Visit: Payer: Self-pay

## 2023-08-11 ENCOUNTER — Ambulatory Visit: Payer: Self-pay | Attending: Nurse Practitioner | Admitting: Nurse Practitioner

## 2023-08-11 ENCOUNTER — Encounter: Payer: Self-pay | Admitting: Nurse Practitioner

## 2023-08-11 VITALS — BP 138/78 | HR 82 | Resp 19 | Ht 62.0 in | Wt 208.0 lb

## 2023-08-11 DIAGNOSIS — J3089 Other allergic rhinitis: Secondary | ICD-10-CM

## 2023-08-11 DIAGNOSIS — E785 Hyperlipidemia, unspecified: Secondary | ICD-10-CM

## 2023-08-11 DIAGNOSIS — Z6838 Body mass index (BMI) 38.0-38.9, adult: Secondary | ICD-10-CM

## 2023-08-11 DIAGNOSIS — I739 Peripheral vascular disease, unspecified: Secondary | ICD-10-CM

## 2023-08-11 DIAGNOSIS — E669 Obesity, unspecified: Secondary | ICD-10-CM

## 2023-08-11 DIAGNOSIS — I10 Essential (primary) hypertension: Secondary | ICD-10-CM

## 2023-08-11 DIAGNOSIS — R7303 Prediabetes: Secondary | ICD-10-CM

## 2023-08-11 DIAGNOSIS — E559 Vitamin D deficiency, unspecified: Secondary | ICD-10-CM

## 2023-08-11 DIAGNOSIS — D649 Anemia, unspecified: Secondary | ICD-10-CM

## 2023-08-11 DIAGNOSIS — Z1231 Encounter for screening mammogram for malignant neoplasm of breast: Secondary | ICD-10-CM

## 2023-08-11 DIAGNOSIS — E782 Mixed hyperlipidemia: Secondary | ICD-10-CM

## 2023-08-11 DIAGNOSIS — Z780 Asymptomatic menopausal state: Secondary | ICD-10-CM

## 2023-08-11 DIAGNOSIS — Z1211 Encounter for screening for malignant neoplasm of colon: Secondary | ICD-10-CM

## 2023-08-11 MED ORDER — ALBUTEROL SULFATE HFA 108 (90 BASE) MCG/ACT IN AERS
2.0000 | INHALATION_SPRAY | Freq: Four times a day (QID) | RESPIRATORY_TRACT | 0 refills | Status: DC | PRN
  Filled 2023-08-11: qty 6.7, 25d supply, fill #0

## 2023-08-11 MED ORDER — ATORVASTATIN CALCIUM 20 MG PO TABS
20.0000 mg | ORAL_TABLET | Freq: Every day | ORAL | 2 refills | Status: DC
  Filled 2023-08-11: qty 90, 90d supply, fill #0
  Filled 2023-08-20: qty 30, 30d supply, fill #0
  Filled 2023-09-21: qty 30, 30d supply, fill #1
  Filled 2023-10-21: qty 30, 30d supply, fill #2
  Filled 2023-11-30: qty 30, 30d supply, fill #3
  Filled 2024-01-11: qty 30, 30d supply, fill #4
  Filled 2024-02-05: qty 30, 30d supply, fill #5
  Filled 2024-03-16 (×2): qty 30, 30d supply, fill #6

## 2023-08-11 MED ORDER — FLUTICASONE PROPIONATE 50 MCG/ACT NA SUSP
2.0000 | Freq: Every day | NASAL | 6 refills | Status: AC
  Filled 2023-08-11: qty 16, 30d supply, fill #0
  Filled 2024-02-05: qty 16, 30d supply, fill #1

## 2023-08-11 MED ORDER — ASPIRIN 81 MG PO TBEC
81.0000 mg | DELAYED_RELEASE_TABLET | Freq: Every day | ORAL | 1 refills | Status: DC
  Filled 2023-08-11: qty 90, 90d supply, fill #0
  Filled 2023-11-30: qty 90, 90d supply, fill #1

## 2023-08-11 MED ORDER — CETIRIZINE HCL 10 MG PO TABS
10.0000 mg | ORAL_TABLET | Freq: Every day | ORAL | 1 refills | Status: DC
  Filled 2023-08-11: qty 90, 90d supply, fill #0
  Filled 2023-08-20: qty 30, 30d supply, fill #0
  Filled 2023-09-21: qty 30, 30d supply, fill #1
  Filled 2023-10-21: qty 30, 30d supply, fill #2
  Filled 2023-11-30: qty 30, 30d supply, fill #3
  Filled 2024-01-11: qty 30, 30d supply, fill #4
  Filled 2024-02-05: qty 30, 30d supply, fill #5

## 2023-08-11 MED ORDER — METOPROLOL SUCCINATE ER 50 MG PO TB24
50.0000 mg | ORAL_TABLET | Freq: Every day | ORAL | 1 refills | Status: DC
  Filled 2023-08-11: qty 90, 90d supply, fill #0
  Filled 2023-08-20: qty 30, 30d supply, fill #0
  Filled 2023-09-21: qty 30, 30d supply, fill #1
  Filled 2023-10-21: qty 30, 30d supply, fill #2
  Filled 2023-11-30 (×2): qty 30, 30d supply, fill #3
  Filled 2024-01-11: qty 30, 30d supply, fill #4
  Filled 2024-02-05: qty 30, 30d supply, fill #5

## 2023-08-11 MED ORDER — FERROUS GLUCONATE 324 (38 FE) MG PO TABS
324.0000 mg | ORAL_TABLET | Freq: Every day | ORAL | 1 refills | Status: DC
  Filled 2023-08-11: qty 100, 100d supply, fill #0
  Filled 2023-11-30: qty 80, 80d supply, fill #1

## 2023-08-11 MED ORDER — LOSARTAN POTASSIUM-HCTZ 100-25 MG PO TABS
1.0000 | ORAL_TABLET | Freq: Every day | ORAL | 1 refills | Status: DC
  Filled 2023-08-11: qty 90, 90d supply, fill #0
  Filled 2023-08-20: qty 30, 30d supply, fill #0
  Filled 2023-09-21: qty 30, 30d supply, fill #1
  Filled 2023-10-21: qty 30, 30d supply, fill #2
  Filled 2023-11-30: qty 30, 30d supply, fill #3
  Filled 2024-01-11: qty 30, 30d supply, fill #4
  Filled 2024-02-05: qty 30, 30d supply, fill #5

## 2023-08-11 NOTE — Patient Instructions (Signed)
[facility] has been trying to call you to schedule a mammogram. You can call them at any of these numbers to schedule.  [phone]

## 2023-08-11 NOTE — Progress Notes (Signed)
 Assessment & Plan:   Paula Alexander was seen today for hypertension.  Diagnoses and all orders for this visit:  Primary hypertension -     losartan -hydrochlorothiazide  (HYZAAR ) 100-25 MG tablet; Take 1 tablet by mouth daily. -     metoprolol  succinate (TOPROL -XL) 50 MG 24 hr tablet; Take 1 tablet (50 mg total) by mouth daily. Take with or immediately following a meal. Continue Hyzaar  and Toprol -XL as prescribed.  Reminded to bring in blood pressure log for follow  up appointment.  RECOMMENDATIONS: DASH/Mediterranean Diets are healthier choices for HTN.    Dyslipidemia, goal LDL below 100 -     atorvastatin  (LIPITOR) 20 MG tablet; Take 1 tablet (20 mg total) by mouth daily. INSTRUCTIONS: Work on a low fat, heart healthy diet and participate in regular aerobic exercise program by working out at least 150 minutes per week; 5 days a week-30 minutes per day. Avoid red meat/beef/steak,  fried foods. junk foods, sodas, sugary drinks, unhealthy snacking, alcohol and smoking.  Drink at least 80 oz of water per day and monitor your carbohydrate intake daily.    Environmental and seasonal allergies -     albuterol  (PROVENTIL  HFA) 108 (90 Base) MCG/ACT inhaler; Inhale 2 puffs into the lungs every 6 (six) hours as needed for wheezing or shortness of breath. -     cetirizine  (ZYRTEC ) 10 MG tablet; Take 1 tablet (10 mg total) by mouth daily. -     fluticasone  (FLONASE ) 50 MCG/ACT nasal spray; Place 2 sprays into both nostrils daily.  Anemia, unspecified type -     ferrous gluconate  (FERGON) 324 MG tablet; Take 1 tablet (324 mg total) by mouth daily with breakfast. -     CBC with Differential  Obesity (BMI 30-39.9) We discussed the goal of 8-10,000 steps 3-4 times per week Calorie counting/carb control  Colon cancer screening -     Fecal occult blood, imunochemical([facility])  Encounter for screening mammogram for malignant neoplasm of breast -     MS 3D SCR MAMMO BILAT BR (aka MM);  Future  PVD (peripheral vascular disease) (HCC) -     aspirin  EC 81 MG tablet; Take 1 tablet (81 mg total) by mouth daily. Swallow whole.  Breast cancer screening by mammogram -     MS 3D SCR MAMMO BILAT BR (aka MM); Future  Abnormal bone density screening -     DG Bone Density; Future  Prediabetes -     CMP14+EGFR -     Hemoglobin A1c  Mixed hyperlipidemia -     Lipid panel  Vitamin D  deficiency disease -     VITAMIN D  25 Hydroxy (Vit-D Deficiency, Fractures)    Patient has been counseled on age-appropriate routine health concerns for screening and prevention. These are reviewed and up-to-date. Referrals have been placed accordingly. Immunizations are up-to-date or declined.    Subjective:   Chief Complaint  Patient presents with   Hypertension    Paula Alexander 68 y.o. female presents to office today for follow up to HTN and medication refills.   She has a past medical history of Anemia, Calcium  deficiency, Heart murmur, Hyperlipidemia, Hypertension, and PVD   Patient has been counseled on age-appropriate routine health concerns for screening and prevention. These are reviewed and up-to-date. Referrals have been placed accordingly. Immunizations are up-to-date or declined.  Mammogram: Overdue. Referral placed and scholarship application was filled out by patient today Colon cancer screening: Overdue.  Stool kit sent home with patient today Pap smear: Not applicable  Bone density: Overdue.  Referral placed.       Blood pressure is well controlled. She is currently prescribed hyzaar  100-25 mg daily and Toprol  XL 50 mg daily BP Readings from Last 3 Encounters:  08/11/23 138/78  09/08/22 136/78  07/28/22 134/79    Prediabetes Well controlled. She was started on ozempic  0.25 mg weekly for better control with cravings and prediabetes however she started experiencing retinal issues and Ozempic  was stopped.  BMI 36.40. LDL near goal with atorvastatin  20 mg daily.  Lab  Results  Component Value Date   HGBA1C 6.1 (H) 09/08/2022    Seasonal allergies are controlled with steroid nasal spray and antihistamine     Review of Systems  Constitutional:  Negative for fever, malaise/fatigue and weight loss.  HENT: Negative.  Negative for nosebleeds.   Eyes: Negative.  Negative for blurred vision, double vision and photophobia.  Respiratory:  Positive for shortness of breath (with exertion). Negative for cough.   Cardiovascular: Negative.  Negative for chest pain, palpitations and leg swelling.  Gastrointestinal: Negative.  Negative for heartburn, nausea and vomiting.  Musculoskeletal: Negative.  Negative for myalgias.  Neurological: Negative.  Negative for dizziness, focal weakness, seizures and headaches.  Endo/Heme/Allergies:  Positive for environmental allergies.  Psychiatric/Behavioral: Negative.  Negative for suicidal ideas.     Past Medical History:  Diagnosis Date   Anemia    Calcium  deficiency    Heart murmur    Hyperlipidemia    Hypertension    PVD (peripheral vascular disease) (HCC)     No past surgical history on file.  Family History  Problem Relation Age of Onset   Hypertension Mother    Stroke Mother    Breast cancer Mother    Multiple sclerosis Sister    Breast cancer Sister    Diabetes Son    Colon cancer Neg Hx    Esophageal cancer Neg Hx    Inflammatory bowel disease Neg Hx    Liver disease Neg Hx    Pancreatic cancer Neg Hx    Rectal cancer Neg Hx    Stomach cancer Neg Hx     Social History Reviewed with no changes to be made today.   Outpatient Medications Prior to Visit  Medication Sig Dispense Refill   Multiple Vitamins-Minerals (MULTI FOR HER) TABS Take 1 tablet by mouth daily. 100 tablet 0   albuterol  (PROVENTIL  HFA) 108 (90 Base) MCG/ACT inhaler Inhale 2 puffs into the lungs every 6 (six) hours as needed for wheezing or shortness of breath. 6.7 g 0   aspirin  EC 81 MG tablet Take 1 tablet (81 mg total) by mouth  daily. Swallow whole. 90 tablet 3   atorvastatin  (LIPITOR) 20 MG tablet Take 1 tablet (20 mg total) by mouth daily. 90 tablet 2   cetirizine  (ZYRTEC ) 10 MG tablet Take 1 tablet (10 mg total) by mouth daily. 30 tablet 0   ferrous gluconate  (FERGON) 324 MG tablet Take 1 tablet (324 mg total) by mouth daily with breakfast. 30 tablet 0   fluticasone  (FLONASE ) 50 MCG/ACT nasal spray Place 2 sprays into both nostrils daily. 16 g 0   losartan -hydrochlorothiazide  (HYZAAR ) 100-25 MG tablet Take 1 tablet by mouth daily. 30 tablet 0   metoprolol  succinate (TOPROL -XL) 50 MG 24 hr tablet Take 1 tablet (50 mg total) by mouth daily. Take with or immediately following a meal. 30 tablet 0   No facility-administered medications prior to visit.    No Known Allergies     Objective:  BP 138/78 (BP Location: Left Arm, Patient Position: Sitting, Cuff Size: Large)   Pulse 82   Resp 19   Ht 5\' 2"  (1.575 m)   Wt 208 lb (94.3 kg)   SpO2 96%   BMI 38.04 kg/m  Wt Readings from Last 3 Encounters:  08/11/23 208 lb (94.3 kg)  09/08/22 190 lb 3.2 oz (86.3 kg)  06/06/22 188 lb (85.3 kg)    Physical Exam Vitals and nursing note reviewed.  Constitutional:      Appearance: She is well-developed.  HENT:     Head: Normocephalic and atraumatic.  Cardiovascular:     Rate and Rhythm: Normal rate and regular rhythm.     Heart sounds: Normal heart sounds. No murmur heard.    No friction rub. No gallop.  Pulmonary:     Effort: Pulmonary effort is normal. No tachypnea or respiratory distress.     Breath sounds: Normal breath sounds. No decreased breath sounds, wheezing, rhonchi or rales.  Chest:     Chest wall: No tenderness.  Abdominal:     General: Bowel sounds are normal.     Palpations: Abdomen is soft.  Musculoskeletal:        General: Normal range of motion.     Cervical back: Normal range of motion.  Skin:    General: Skin is warm and dry.  Neurological:     Mental Status: She is alert and oriented  to person, place, and time.     Coordination: Coordination normal.  Psychiatric:        Behavior: Behavior normal. Behavior is cooperative.        Thought Content: Thought content normal.        Judgment: Judgment normal.          Patient has been counseled extensively about nutrition and exercise as well as the importance of adherence with medications and regular follow-up. The patient was given clear instructions to go to ER or return to medical center if symptoms don't improve, worsen or new problems develop. The patient verbalized understanding.   Follow-up: Return in about 6 months (around 02/11/2024).   Collins Dean, FNP-BC [facility] [place], [place] [phone]   08/11/2023, 2:58 PM

## 2023-08-12 ENCOUNTER — Ambulatory Visit: Payer: Self-pay | Admitting: Nurse Practitioner

## 2023-08-12 LAB — LIPID PANEL
Chol/HDL Ratio: 3.2 ratio (ref 0.0–4.4)
Cholesterol, Total: 152 mg/dL (ref 100–199)
HDL: 48 mg/dL (ref >39)
LDL Chol Calc (NIH): 80 mg/dL (ref 0–99)
Triglycerides: 139 mg/dL (ref 0–149)
VLDL Cholesterol Cal: 24 mg/dL (ref 5–40)

## 2023-08-12 LAB — CBC WITH DIFFERENTIAL/PLATELET
Basophils Absolute: 0 10*3/uL (ref 0.0–0.2)
Basos: 1 %
EOS (ABSOLUTE): 0.1 10*3/uL (ref 0.0–0.4)
Eos: 1 %
Hematocrit: 35.2 % (ref 34.0–46.6)
Hemoglobin: 11.4 g/dL (ref 11.1–15.9)
Immature Grans (Abs): 0 10*3/uL (ref 0.0–0.1)
Immature Granulocytes: 0 %
Lymphocytes Absolute: 1.8 10*3/uL (ref 0.7–3.1)
Lymphs: 32 %
MCH: 28.7 pg (ref 26.6–33.0)
MCHC: 32.4 g/dL (ref 31.5–35.7)
MCV: 89 fL (ref 79–97)
Monocytes Absolute: 0.5 10*3/uL (ref 0.1–0.9)
Monocytes: 8 %
Neutrophils Absolute: 3.3 10*3/uL (ref 1.4–7.0)
Neutrophils: 58 %
Platelets: 241 10*3/uL (ref 150–450)
RBC: 3.97 x10E6/uL (ref 3.77–5.28)
RDW: 13.4 % (ref 11.7–15.4)
WBC: 5.7 10*3/uL (ref 3.4–10.8)

## 2023-08-12 LAB — VITAMIN D 25 HYDROXY (VIT D DEFICIENCY, FRACTURES): Vit D, 25-Hydroxy: 26.3 ng/mL — ABNORMAL LOW (ref 30.0–100.0)

## 2023-08-12 LAB — CMP14+EGFR
ALT: 22 IU/L (ref 0–32)
AST: 30 IU/L (ref 0–40)
Albumin: 4.2 g/dL (ref 3.9–4.9)
Alkaline Phosphatase: 96 IU/L (ref 44–121)
BUN/Creatinine Ratio: 16 (ref 12–28)
BUN: 14 mg/dL (ref 8–27)
Bilirubin Total: 0.2 mg/dL (ref 0.0–1.2)
CO2: 21 mmol/L (ref 20–29)
Calcium: 10 mg/dL (ref 8.7–10.3)
Chloride: 107 mmol/L — ABNORMAL HIGH (ref 96–106)
Creatinine, Ser: 0.9 mg/dL (ref 0.57–1.00)
Globulin, Total: 2.6 g/dL (ref 1.5–4.5)
Glucose: 92 mg/dL (ref 70–99)
Potassium: 5 mmol/L (ref 3.5–5.2)
Sodium: 144 mmol/L (ref 134–144)
Total Protein: 6.8 g/dL (ref 6.0–8.5)
eGFR: 70 mL/min/{1.73_m2} (ref >59)

## 2023-08-12 LAB — HEMOGLOBIN A1C
Est. average glucose Bld gHb Est-mCnc: 140 mg/dL
Hgb A1c MFr Bld: 6.5 % — ABNORMAL HIGH (ref 4.8–5.6)

## 2023-08-20 ENCOUNTER — Telehealth: Payer: Self-pay

## 2023-08-20 ENCOUNTER — Other Ambulatory Visit: Payer: Self-pay

## 2023-08-20 NOTE — Telephone Encounter (Addendum)
 Telephoned patient at mobile number. Left a voice message with [facility] (scholarship) contact information.  Telephoned patient and left a voice message. 09/07/2023

## 2023-09-21 ENCOUNTER — Other Ambulatory Visit: Payer: Self-pay

## 2023-09-23 ENCOUNTER — Other Ambulatory Visit (HOSPITAL_COMMUNITY): Payer: Self-pay

## 2023-09-24 ENCOUNTER — Other Ambulatory Visit: Payer: Self-pay

## 2023-10-21 ENCOUNTER — Other Ambulatory Visit: Payer: Self-pay

## 2023-10-22 ENCOUNTER — Other Ambulatory Visit: Payer: Self-pay

## 2023-11-04 ENCOUNTER — Other Ambulatory Visit (HOSPITAL_COMMUNITY): Payer: Self-pay

## 2023-11-04 ENCOUNTER — Other Ambulatory Visit: Payer: Self-pay | Admitting: Nurse Practitioner

## 2023-11-04 ENCOUNTER — Other Ambulatory Visit: Payer: Self-pay

## 2023-11-04 ENCOUNTER — Ambulatory Visit: Payer: Self-pay

## 2023-11-04 DIAGNOSIS — U071 COVID-19: Secondary | ICD-10-CM

## 2023-11-04 NOTE — Telephone Encounter (Signed)
 Pt called multiple times today for antiviral for covid, pt states that she was told today that it would be sent by 1700, calling as the rx has not been sent in at this time. Pt advised to expect update tomorrow. Pt states that she is on day 5 of illness. Pt disconnected call.

## 2023-11-04 NOTE — Telephone Encounter (Signed)
 I am not sure who instructed Paula Alexander that her medication would be sent by 5pm. I was in clinic all day yesterday and did not receive this message until well after clinic and 5pm. Also, there is a message stating she called several times. I have only received this message. As it is after the window of time for Paxlovid I would need to know her current symptoms and can send other medications to treat at this time.

## 2023-11-04 NOTE — Telephone Encounter (Signed)
 FYI Only or Action Required?: Action required by provider: Paxlovid request.  Patient was last seen in primary care on 08/11/2023 by Theotis Haze ORN, NP.  Called Nurse Triage reporting Covid Positive.  Symptoms began several days ago.  Interventions attempted: Rest, hydration, or home remedies.  Symptoms are: gradually improving.  Triage Disposition: Call PCP Within 24 Hours  Patient/caregiver understands and will follow disposition?:   Copied from CRM #[id number]. Topic: General - Other >> Nov 04, 2023  1:52 PM Paula Alexander wrote: Reason for CRM: PEC-Patient tested Positive for Covid and needs medication for this issue. Transferred to:  Haze Theotis, FNP-BC [address], [place], [place] [address]  [phone] >> Nov 04, 2023  1:59 PM Paula Alexander wrote: Patient called to make sure the message was sent to her provider that she has covid and need the paxlovid script sent to her pharmacy Reason for Disposition  [1] HIGH RISK patient (e.g., weak immune system, age > 64 years, obesity with BMI 30 or higher, pregnant, chronic lung disease or other chronic medical condition) AND [2] COVID symptoms (e.g., cough, fever)  (Exceptions: Already seen by PCP and no new or worsening symptoms.)  Answer Assessment - Initial Assessment Questions 1. COVID-19 DIAGNOSIS: How do you know that you have COVID? (e.g., positive lab test or self-test, diagnosed by doctor or NP/PA, symptoms after exposure).     Self test 2. COVID-19 EXPOSURE: Was there any known exposure to COVID before the symptoms began? CDC Definition of close contact: within 6 feet (2 meters) for a total of 15 minutes or more over a 24-hour period.      unknown 3. ONSET: When did the COVID-19 symptoms start?      Saturday 4. WORST SYMPTOM: What is your worst symptom? (e.g., cough, fever, shortness of breath, muscle aches)     cough 5. COUGH: Do you have a cough? If Yes, ask: How bad is the cough?       Non productive 6.  FEVER: Do you have a fever? If Yes, ask: What is your temperature, how was it measured, and when did it start?     Yes, chills 7. RESPIRATORY STATUS: Describe your breathing? (e.g., normal; shortness of breath, wheezing, unable to speak)      Denies. She had covid in the past with breathing difficulty, breathing fine this time, she would like Paxlovid to prevent serious symptoms 8. BETTER-SAME-WORSE: Are you getting better, staying the same or getting worse compared to yesterday?  If getting worse, ask, In what way?     Slightly improved 9. OTHER SYMPTOMS: Do you have any other symptoms?  (e.g., chills, fatigue, headache, loss of smell or taste, muscle pain, sore throat)     Runny nose  Additional info: Requesting Paxlovid prescription to be sent today.  Protocols used: Coronavirus (COVID-19) Diagnosed or Suspected-A-AH

## 2023-11-05 ENCOUNTER — Ambulatory Visit: Payer: Self-pay | Admitting: Physician Assistant

## 2023-11-05 ENCOUNTER — Encounter: Payer: Self-pay | Admitting: Physician Assistant

## 2023-11-05 ENCOUNTER — Other Ambulatory Visit: Payer: Self-pay

## 2023-11-05 VITALS — BP 102/67 | HR 92 | Ht 62.0 in | Wt 200.0 lb

## 2023-11-05 DIAGNOSIS — U071 COVID-19: Secondary | ICD-10-CM

## 2023-11-05 LAB — POC COVID19/FLU A&B COMBO
Covid Antigen, POC: NEGATIVE
Influenza A Antigen, POC: NEGATIVE
Influenza B Antigen, POC: NEGATIVE

## 2023-11-05 MED ORDER — AZITHROMYCIN 250 MG PO TABS
ORAL_TABLET | ORAL | 0 refills | Status: AC
  Filled 2023-11-05: qty 6, 5d supply, fill #0

## 2023-11-05 MED ORDER — BENZONATATE 100 MG PO CAPS
100.0000 mg | ORAL_CAPSULE | Freq: Three times a day (TID) | ORAL | 0 refills | Status: DC | PRN
  Filled 2023-11-05: qty 20, 4d supply, fill #0

## 2023-11-05 MED ORDER — METHYLPREDNISOLONE 4 MG PO TBPK
ORAL_TABLET | ORAL | 0 refills | Status: DC
  Filled 2023-11-05: qty 21, 6d supply, fill #0

## 2023-11-05 NOTE — Telephone Encounter (Addendum)
 Spoke with patient . Patient Coughing, Very congested and Diarrhea. Patient sound very congested and was coughing during our entire conversation . Patient voiced that her s/s s/s started Saturday. Visit with MU today scheduled. MU aware.

## 2023-11-05 NOTE — Progress Notes (Signed)
 Established Patient Office Visit  Subjective   Patient ID: Paula Alexander, female    DOB: [birth year]  Age: 68 y.o. MRN: [id number]  Chief Complaint  Patient presents with   Fatigue        exposure to covid    Patient was exposed by a member at church.    Diarrhea   Headache   Discussed the use of AI scribe software for clinical note transcription with the patient, who gave verbal consent to proceed.  History of Present Illness   Paula Alexander is a 68 year old female who presents with respiratory symptoms and fatigue.  This is her fifth episode of COVID-19, with the third infection significantly affecting her breathing and requiring an inhaler. Current symptoms include a sore throat, headache, chronic cough with yellow sputum, and severe fatigue. Oxygen saturation fluctuates between 93 and 94%. She experiences two to three episodes of diarrhea daily. Initial symptoms included fever, chills, body aches, and diarrhea. She manages her symptoms with Robitussin AM and PM, Tylenol, Vicks, and cough drops. Her children ensure she maintains hydration and nutrition, providing lentils, protein, and good water.   States that her symptoms started on Saturday after being exposed to a church member with COVID.  Home test positive.      Past Medical History:  Diagnosis Date   Anemia    Calcium  deficiency    Heart murmur    Hyperlipidemia    Hypertension    PVD (peripheral vascular disease) (HCC)    Social History   Socioeconomic History   Marital status: Legally Separated    Spouse name: Not on file   Number of children: Not on file   Years of education: Not on file   Highest education level: Not on file  Occupational History   Not on file  Tobacco Use   Smoking status: Never   Smokeless tobacco: Never  Vaping Use   Vaping status: Not on file  Substance and Sexual Activity   Alcohol use: No   Drug use: No   Sexual activity: Not on file  Other Topics Concern    Not on file  Social History Narrative   Not on file   Social Drivers of Health   Financial Resource Strain: Not on file  Food Insecurity: Not on file  Transportation Needs: Not on file  Physical Activity: Not on file  Stress: Not on file  Social Connections: Not on file  Intimate Partner Violence: Not on file   Family History  Problem Relation Age of Onset   Hypertension Mother    Stroke Mother    Breast cancer Mother    Multiple sclerosis Sister    Breast cancer Sister    Diabetes Son    Colon cancer Neg Hx    Esophageal cancer Neg Hx    Inflammatory bowel disease Neg Hx    Liver disease Neg Hx    Pancreatic cancer Neg Hx    Rectal cancer Neg Hx    Stomach cancer Neg Hx    No Known Allergies  Review of Systems  Constitutional:  Positive for malaise/fatigue. Negative for chills and fever.  HENT:  Positive for congestion and sore throat. Negative for sinus pain.   Eyes: Negative.   Respiratory:  Positive for cough and sputum production. Negative for shortness of breath and wheezing.   Cardiovascular:  Negative for chest pain.  Gastrointestinal:  Positive for diarrhea. Negative for abdominal pain, nausea and vomiting.  Genitourinary: Negative.  Musculoskeletal:  Negative for myalgias.  Skin: Negative.   Neurological: Negative.   Endo/Heme/Allergies: Negative.   Psychiatric/Behavioral: Negative.        Objective:     BP 102/67 (BP Location: Left Arm, Patient Position: Sitting, Cuff Size: Large)   Pulse 92   Ht 5' 2 (1.575 m)   Wt 200 lb (90.7 kg)   SpO2 98%   BMI 36.58 kg/m  BP Readings from Last 3 Encounters:  11/05/23 102/67  08/11/23 138/78  09/08/22 136/78   Wt Readings from Last 3 Encounters:  11/05/23 200 lb (90.7 kg)  08/11/23 208 lb (94.3 kg)  09/08/22 190 lb 3.2 oz (86.3 kg)    Physical Exam Vitals and nursing note reviewed.  Constitutional:      Appearance: Normal appearance.  HENT:     Head: Normocephalic and atraumatic.     Right  Ear: External ear normal.     Left Ear: External ear normal.     Nose: Nose normal.     Mouth/Throat:     Mouth: Mucous membranes are moist.     Pharynx: Oropharynx is clear.  Eyes:     Extraocular Movements: Extraocular movements intact.     Conjunctiva/sclera: Conjunctivae normal.     Pupils: Pupils are equal, round, and reactive to light.  Cardiovascular:     Rate and Rhythm: Normal rate and regular rhythm.     Pulses: Normal pulses.     Heart sounds: Normal heart sounds.  Pulmonary:     Effort: Pulmonary effort is normal. No respiratory distress.     Breath sounds: Normal breath sounds. No wheezing.  Musculoskeletal:        General: Normal range of motion.     Cervical back: Normal range of motion and neck supple.  Skin:    General: Skin is warm and dry.  Neurological:     General: No focal deficit present.     Mental Status: She is alert and oriented to person, place, and time.  Psychiatric:        Mood and Affect: Mood normal.        Behavior: Behavior normal.        Thought Content: Thought content normal.        Judgment: Judgment normal.        Assessment & Plan:   Problem List Items Addressed This Visit   None Visit Diagnoses       Exposure to COVID-19 virus    -  Primary   Relevant Orders   POC Covid19/Flu A&B Antigen      1. COVID (Primary) Home COVID test positive, reasonable to treat based on presentation, prolonged symptoms, significant history of previous COVID infections.  Trial azithromycin , Medrol  Dosepak, Tessalon  Perles.  Patient education given on supportive care, red flags given for prompt reevaluation. - POC Covid19/Flu A&B Antigen - azithromycin  (ZITHROMAX ) 250 MG tablet; Take 2 tablets (500 mg total) 1st day, THEN 1 tablet (250 mg total) daily for 4 days.  Dispense: 6 tablet; Refill: 0 - methylPREDNISolone  (MEDROL  DOSEPAK) 4 MG TBPK tablet; Use per instructions on package  Dispense: 21 tablet; Refill: 0 - benzonatate  (TESSALON ) 100 MG  capsule; Take 1-2 capsules (100-200 mg total) by mouth 3 (three) times daily as needed.  Dispense: 20 capsule; Refill: 0   I have reviewed the patient's medical history (PMH, PSH, Social History, Family History, Medications, and allergies) , and have been updated if relevant. I spent 30 minutes reviewing chart and  face  to face time with patient.    No follow-ups on file.    Kirk RAMAN Mayers, PA-C

## 2023-11-09 ENCOUNTER — Other Ambulatory Visit: Payer: Self-pay | Admitting: Physician Assistant

## 2023-11-09 ENCOUNTER — Other Ambulatory Visit: Payer: Self-pay

## 2023-11-09 ENCOUNTER — Telehealth: Payer: Self-pay | Admitting: Physician Assistant

## 2023-11-09 DIAGNOSIS — U071 COVID-19: Secondary | ICD-10-CM

## 2023-11-09 MED ORDER — BENZONATATE 100 MG PO CAPS
100.0000 mg | ORAL_CAPSULE | Freq: Three times a day (TID) | ORAL | 0 refills | Status: DC | PRN
  Filled 2023-11-09: qty 20, 4d supply, fill #0

## 2023-11-09 NOTE — Telephone Encounter (Signed)
 Copied from CRM [id number]. Topic: Clinical - Medical Advice >> Nov 09, 2023  2:20 PM Alfonso ORN wrote: Reason for CRM: was seen at the mobile clinic for covid  trying to get more coughing pills Benzonatate  100 mg and would like to check out lungs and pt. Have other question regarding the mobile clinic  Patient request for a nurse to call her back  Pt. Call back # [phone]

## 2023-11-30 ENCOUNTER — Other Ambulatory Visit: Payer: Self-pay

## 2023-12-01 ENCOUNTER — Other Ambulatory Visit: Payer: Self-pay

## 2024-01-11 ENCOUNTER — Other Ambulatory Visit: Payer: Self-pay

## 2024-01-13 ENCOUNTER — Other Ambulatory Visit: Payer: Self-pay

## 2024-02-05 ENCOUNTER — Other Ambulatory Visit: Payer: Self-pay

## 2024-02-05 ENCOUNTER — Other Ambulatory Visit (HOSPITAL_COMMUNITY): Payer: Self-pay

## 2024-02-05 ENCOUNTER — Telehealth: Payer: Self-pay | Admitting: Nurse Practitioner

## 2024-02-05 NOTE — Telephone Encounter (Signed)
 Contacted patient. Appointment rescheduled for 03/22/2024 at 2:30 pm. Patient acknowledged appointment.

## 2024-02-05 NOTE — Telephone Encounter (Signed)
 Copied from CRM [id number]. Topic: Appointments - Appointment Cancel/Reschedule >> Feb 05, 2024 12:49 PM Paula Alexander wrote:  Patient/patient representative is calling to cancel or reschedule an appointment. Patient callback number is [phone] leave a voicemail if she does not answer

## 2024-02-08 ENCOUNTER — Other Ambulatory Visit: Payer: Self-pay

## 2024-02-10 ENCOUNTER — Other Ambulatory Visit: Payer: Self-pay

## 2024-02-15 ENCOUNTER — Ambulatory Visit: Payer: Self-pay | Admitting: Nurse Practitioner

## 2024-03-16 ENCOUNTER — Other Ambulatory Visit: Payer: Self-pay

## 2024-03-16 ENCOUNTER — Other Ambulatory Visit: Payer: Self-pay | Admitting: Nurse Practitioner

## 2024-03-16 DIAGNOSIS — I10 Essential (primary) hypertension: Secondary | ICD-10-CM

## 2024-03-16 DIAGNOSIS — J3089 Other allergic rhinitis: Secondary | ICD-10-CM

## 2024-03-17 MED ORDER — METOPROLOL SUCCINATE ER 50 MG PO TB24
50.0000 mg | ORAL_TABLET | Freq: Every day | ORAL | 0 refills | Status: DC
  Filled 2024-03-17: qty 30, 30d supply, fill #0

## 2024-03-17 MED ORDER — LOSARTAN POTASSIUM-HCTZ 100-25 MG PO TABS
1.0000 | ORAL_TABLET | Freq: Every day | ORAL | 0 refills | Status: DC
  Filled 2024-03-17: qty 30, 30d supply, fill #0

## 2024-03-17 MED ORDER — CETIRIZINE HCL 10 MG PO TABS
10.0000 mg | ORAL_TABLET | Freq: Every day | ORAL | 0 refills | Status: DC
  Filled 2024-03-17: qty 30, 30d supply, fill #0

## 2024-03-18 ENCOUNTER — Other Ambulatory Visit: Payer: Self-pay

## 2024-03-21 ENCOUNTER — Other Ambulatory Visit: Payer: Self-pay

## 2024-03-22 ENCOUNTER — Encounter: Payer: Self-pay | Admitting: Nurse Practitioner

## 2024-03-22 ENCOUNTER — Other Ambulatory Visit: Payer: Self-pay

## 2024-03-22 ENCOUNTER — Ambulatory Visit: Payer: Self-pay | Attending: Nurse Practitioner | Admitting: Nurse Practitioner

## 2024-03-22 VITALS — BP 142/81 | HR 82 | Ht 62.0 in | Wt 193.0 lb

## 2024-03-22 DIAGNOSIS — J3089 Other allergic rhinitis: Secondary | ICD-10-CM

## 2024-03-22 DIAGNOSIS — Z7182 Exercise counseling: Secondary | ICD-10-CM | POA: Insufficient documentation

## 2024-03-22 DIAGNOSIS — Z1211 Encounter for screening for malignant neoplasm of colon: Secondary | ICD-10-CM

## 2024-03-22 DIAGNOSIS — Z7982 Long term (current) use of aspirin: Secondary | ICD-10-CM | POA: Insufficient documentation

## 2024-03-22 DIAGNOSIS — E559 Vitamin D deficiency, unspecified: Secondary | ICD-10-CM | POA: Insufficient documentation

## 2024-03-22 DIAGNOSIS — E785 Hyperlipidemia, unspecified: Secondary | ICD-10-CM | POA: Insufficient documentation

## 2024-03-22 DIAGNOSIS — D649 Anemia, unspecified: Secondary | ICD-10-CM | POA: Insufficient documentation

## 2024-03-22 DIAGNOSIS — Z6835 Body mass index (BMI) 35.0-35.9, adult: Secondary | ICD-10-CM | POA: Insufficient documentation

## 2024-03-22 DIAGNOSIS — R0609 Other forms of dyspnea: Secondary | ICD-10-CM | POA: Insufficient documentation

## 2024-03-22 DIAGNOSIS — Z79899 Other long term (current) drug therapy: Secondary | ICD-10-CM | POA: Insufficient documentation

## 2024-03-22 DIAGNOSIS — J302 Other seasonal allergic rhinitis: Secondary | ICD-10-CM | POA: Insufficient documentation

## 2024-03-22 DIAGNOSIS — I739 Peripheral vascular disease, unspecified: Secondary | ICD-10-CM | POA: Insufficient documentation

## 2024-03-22 DIAGNOSIS — I10 Essential (primary) hypertension: Secondary | ICD-10-CM | POA: Insufficient documentation

## 2024-03-22 DIAGNOSIS — R053 Chronic cough: Secondary | ICD-10-CM | POA: Insufficient documentation

## 2024-03-22 MED ORDER — LOSARTAN POTASSIUM-HCTZ 100-25 MG PO TABS
1.0000 | ORAL_TABLET | Freq: Every day | ORAL | 1 refills | Status: AC
  Filled 2024-03-22 – 2024-04-20 (×2): qty 90, 90d supply, fill #0

## 2024-03-22 MED ORDER — ASPIRIN 81 MG PO TBEC
81.0000 mg | DELAYED_RELEASE_TABLET | Freq: Every day | ORAL | 1 refills | Status: AC
  Filled 2024-03-22: qty 90, 90d supply, fill #0

## 2024-03-22 MED ORDER — ATORVASTATIN CALCIUM 20 MG PO TABS
20.0000 mg | ORAL_TABLET | Freq: Every day | ORAL | 3 refills | Status: AC
  Filled 2024-03-22 – 2024-04-20 (×2): qty 90, 90d supply, fill #0

## 2024-03-22 MED ORDER — BENZONATATE 200 MG PO CAPS
200.0000 mg | ORAL_CAPSULE | Freq: Three times a day (TID) | ORAL | 1 refills | Status: AC | PRN
  Filled 2024-03-22: qty 30, 10d supply, fill #0

## 2024-03-22 MED ORDER — ALBUTEROL SULFATE HFA 108 (90 BASE) MCG/ACT IN AERS
2.0000 | INHALATION_SPRAY | Freq: Four times a day (QID) | RESPIRATORY_TRACT | 1 refills | Status: AC | PRN
  Filled 2024-03-22: qty 6.7, 25d supply, fill #0

## 2024-03-22 MED ORDER — METOPROLOL SUCCINATE ER 50 MG PO TB24
50.0000 mg | ORAL_TABLET | Freq: Every day | ORAL | 1 refills | Status: AC
  Filled 2024-03-22 – 2024-04-20 (×2): qty 90, 90d supply, fill #0

## 2024-03-22 MED ORDER — FERROUS GLUCONATE 324 (38 FE) MG PO TABS
324.0000 mg | ORAL_TABLET | Freq: Every day | ORAL | 1 refills | Status: AC
  Filled 2024-03-22: qty 90, 90d supply, fill #0

## 2024-03-22 NOTE — Progress Notes (Signed)
 "  Assessment & Plan:  Hannia was seen today for hypertension.  Diagnoses and all orders for this visit:  Primary hypertension -     losartan -hydrochlorothiazide  (HYZAAR ) 100-25 MG tablet; Take 1 tablet by mouth daily. -     metoprolol  succinate (TOPROL -XL) 50 MG 24 hr tablet; Take 1 tablet (50 mg total) by mouth daily. Take with or immediately following a meal.  Chronic cough -     benzonatate  (TESSALON ) 200 MG capsule; Take 1 capsule (200 mg total) by mouth 3 (three) times daily as needed.  Dyspnea on exertion -     DG Chest 2 View; Future  Environmental and seasonal allergies -     albuterol  (PROVENTIL  HFA) 108 (90 Base) MCG/ACT inhaler; Inhale 2 puffs into the lungs every 6 (six) hours as needed for wheezing or shortness of breath.  PVD (peripheral vascular disease) -     aspirin  EC 81 MG tablet; Take 1 tablet (81 mg total) by mouth daily. Swallow whole.  Dyslipidemia, goal LDL below 100 -     atorvastatin  (LIPITOR) 20 MG tablet; Take 1 tablet (20 mg total) by mouth daily.  Anemia, unspecified type -     ferrous gluconate  (FERGON) 324 MG tablet; Take 1 tablet (324 mg total) by mouth daily with breakfast.  Vitamin D  deficiency disease -     VITAMIN D  25 Hydroxy (Vit-D Deficiency, Fractures)  Colon cancer screening -     Ambulatory referral to Gastroenterology  Obesity, morbid (HCC) BMI 35 with HTN. Discussed diet and exercise for person with BMI >35. Instructed: You must burn more calories than you eat. Losing 5 percent of your body weight should be considered a success. In the longer term, losing more than 15 percent of your body weight and staying at this weight is an extremely good result. However, keep in mind that even losing 5 percent of your body weight leads to important health benefits, so try not to get discouraged if you're not able to lose more than this. Will recheck weight in 3-6 months.    Patient has been counseled on age-appropriate routine health concerns  for screening and prevention. These are reviewed and up-to-date. Referrals have been placed accordingly. Immunizations are up-to-date or declined.    Subjective:   Chief Complaint  Patient presents with   Hypertension    Paula Alexander 69 y.o. female presents to office today for follow up to HTN  She has a past medical history of Anemia, Calcium  deficiency, Heart murmur, Hyperlipidemia, Hypertension, and PVD    Patient has been counseled on age-appropriate routine health concerns for screening and prevention. These are reviewed and up-to-date. Referrals have been placed accordingly. Immunizations are up-to-date or declined.  Mammogram: Overdue. Referral placed Colon cancer screening: Overdue. Referral placed Pap smear: Not applicable Bone density: Overdue.  Referral placed    HTN Blood pressure is elevated. She is currently taking hyzaar  100-25 mg daily and toprol  XL 50 mg daily.  BP Readings from Last 3 Encounters:  03/22/24 (!) 142/81  11/05/23 102/67  08/11/23 138/78     She has been experiencing symptoms of URI including cough, rhinorrhea, sore throat and increased mucous production over the past several days. No fever or headache.  She has had COVID-19 five times, with the last infection resulting in what she calls a permanent change in her breathing. Her breathing is labored, and she is unable to perform activities as she used to, indicating a significant impact on her daily life. She  is cautious about upper respiratory infections due to the changes in her breathing post-COVID.  Her last flu and COVID tests four months ago, were negative. She is currently taking Tessalon  for her cough, which she finds helpful, and uses a nasal spray as needed. She also takes iron  supplements. She has not received the RSV vaccine and is unsure about her flu vaccine status. She recalls [facility] contacting her for RSV and pneumonia vaccines. She recalls being covered for pneumonia but is unsure  about her shingles vaccine status.  Review of Systems  Constitutional:  Negative for fever, malaise/fatigue and weight loss.  HENT:  Positive for congestion and sore throat. Negative for nosebleeds.   Eyes: Negative.  Negative for blurred vision, double vision and photophobia.  Respiratory:  Positive for cough and shortness of breath.   Cardiovascular: Negative.  Negative for chest pain, palpitations and leg swelling.  Gastrointestinal: Negative.  Negative for heartburn, nausea and vomiting.  Musculoskeletal: Negative.  Negative for myalgias.  Neurological: Negative.  Negative for dizziness, focal weakness, seizures and headaches.  Psychiatric/Behavioral: Negative.  Negative for suicidal ideas.     Past Medical History:  Diagnosis Date   Anemia    Calcium  deficiency    Heart murmur    Hyperlipidemia    Hypertension    PVD (peripheral vascular disease)     No past surgical history on file.  Family History  Problem Relation Age of Onset   Hypertension Mother    Stroke Mother    Breast cancer Mother    Multiple sclerosis Sister    Breast cancer Sister    Diabetes Son    Colon cancer Neg Hx    Esophageal cancer Neg Hx    Inflammatory bowel disease Neg Hx    Liver disease Neg Hx    Pancreatic cancer Neg Hx    Rectal cancer Neg Hx    Stomach cancer Neg Hx     Social History Reviewed with no changes to be made today.   Outpatient Medications Prior to Visit  Medication Sig Dispense Refill   cetirizine  (ALLERGY RELIEF CETIRIZINE ) 10 MG tablet Take 1 tablet (10 mg total) by mouth daily. 30 tablet 0   fluticasone  (FLONASE ) 50 MCG/ACT nasal spray Place 2 sprays into both nostrils daily. 16 g 6   Multiple Vitamins-Minerals (MULTI FOR HER) TABS Take 1 tablet by mouth daily. 100 tablet 0   albuterol  (PROVENTIL  HFA) 108 (90 Base) MCG/ACT inhaler Inhale 2 puffs into the lungs every 6 (six) hours as needed for wheezing or shortness of breath. 6.7 g 0   aspirin  EC 81 MG tablet Take 1  tablet (81 mg total) by mouth daily. Swallow whole. 90 tablet 1   atorvastatin  (LIPITOR) 20 MG tablet Take 1 tablet (20 mg total) by mouth daily. 90 tablet 2   ferrous gluconate  (FERGON) 324 MG tablet Take 1 tablet (324 mg total) by mouth daily with breakfast. 90 tablet 1   losartan -hydrochlorothiazide  (HYZAAR ) 100-25 MG tablet Take 1 tablet by mouth daily. 30 tablet 0   metoprolol  succinate (TOPROL -XL) 50 MG 24 hr tablet Take 1 tablet (50 mg total) by mouth daily. Take with or immediately following a meal. 30 tablet 0   benzonatate  (TESSALON ) 100 MG capsule Take 1-2 capsules (100-200 mg total) by mouth 3 (three) times daily as needed. (Patient not taking: Reported on 03/22/2024) 20 capsule 0   methylPREDNISolone  (MEDROL  DOSEPAK) 4 MG TBPK tablet Use per instructions on package (Patient not taking: Reported on 03/22/2024) 21  tablet 0   No facility-administered medications prior to visit.    Allergies[1]     Objective:    BP (!) 142/81 (BP Location: Left Arm, Patient Position: Sitting, Cuff Size: Normal)   Pulse 82   Ht 5' 2 (1.575 m)   Wt 193 lb (87.5 kg)   SpO2 97%   BMI 35.30 kg/m  Wt Readings from Last 3 Encounters:  03/22/24 193 lb (87.5 kg)  11/05/23 200 lb (90.7 kg)  08/11/23 208 lb (94.3 kg)    Physical Exam Vitals and nursing note reviewed.  Constitutional:      Appearance: She is well-developed.  HENT:     Head: Normocephalic and atraumatic.  Cardiovascular:     Rate and Rhythm: Normal rate and regular rhythm.     Heart sounds: Normal heart sounds. No murmur heard.    No friction rub. No gallop.  Pulmonary:     Effort: Pulmonary effort is normal. No tachypnea or respiratory distress.     Breath sounds: Normal breath sounds. No decreased breath sounds, wheezing, rhonchi or rales.  Chest:     Chest wall: No tenderness.  Musculoskeletal:        General: Normal range of motion.     Cervical back: Normal range of motion.  Skin:    General: Skin is warm and dry.   Neurological:     Mental Status: She is alert and oriented to person, place, and time.     Coordination: Coordination normal.  Psychiatric:        Behavior: Behavior normal. Behavior is cooperative.        Thought Content: Thought content normal.        Judgment: Judgment normal.          Patient has been counseled extensively about nutrition and exercise as well as the importance of adherence with medications and regular follow-up. The patient was given clear instructions to go to ER or return to medical center if symptoms don't improve, worsen or new problems develop. The patient verbalized understanding.   Follow-up: Return in about 4 months (around 07/20/2024).   Haze LELON Servant, FNP-BC [facility] [place], [place] [phone]   03/22/2024, 5:22 PM     [1] No Known Allergies  "

## 2024-03-22 NOTE — Patient Instructions (Addendum)
 [facility] [address]  [id number]    [facility] Located in: Donna MANO [facility] [address] Address: [address], [place], [place] [address] Phone: [phone]     [facility] Address: [address] [place], [place] [address] Phone: [phone]

## 2024-03-23 ENCOUNTER — Ambulatory Visit: Payer: Self-pay | Admitting: Nurse Practitioner

## 2024-03-23 LAB — VITAMIN D 25 HYDROXY (VIT D DEFICIENCY, FRACTURES): Vit D, 25-Hydroxy: 34.5 ng/mL (ref 30.0–100.0)

## 2024-04-20 ENCOUNTER — Other Ambulatory Visit: Payer: Self-pay

## 2024-04-20 ENCOUNTER — Other Ambulatory Visit: Payer: Self-pay | Admitting: Family Medicine

## 2024-04-20 DIAGNOSIS — J3089 Other allergic rhinitis: Secondary | ICD-10-CM

## 2024-04-20 MED ORDER — CETIRIZINE HCL 10 MG PO TABS
10.0000 mg | ORAL_TABLET | Freq: Every day | ORAL | 0 refills | Status: AC
  Filled 2024-04-20: qty 90, 90d supply, fill #0

## 2024-04-21 ENCOUNTER — Other Ambulatory Visit: Payer: Self-pay
